# Patient Record
Sex: Female | Born: 1975 | Race: White | Hispanic: No | Marital: Single | Smoking: Never smoker
Health system: Southern US, Community
[De-identification: ages and names within clinical notes are randomized; demographics above are authoritative.]

## PROBLEM LIST (undated history)

## (undated) HISTORY — PX: OTHER SURGICAL HISTORY: SHX169

---

## 1998-03-07 ENCOUNTER — Other Ambulatory Visit: Admission: RE | Admit: 1998-03-07 | Discharge: 1998-03-07 | Payer: Self-pay | Admitting: Family Medicine

## 1998-03-08 ENCOUNTER — Other Ambulatory Visit: Admission: RE | Admit: 1998-03-08 | Discharge: 1998-03-08 | Payer: Self-pay | Admitting: Family Medicine

## 2009-09-20 ENCOUNTER — Ambulatory Visit (HOSPITAL_COMMUNITY): Admission: RE | Admit: 2009-09-20 | Discharge: 2009-09-20 | Payer: Self-pay | Admitting: Obstetrics and Gynecology

## 2009-10-17 ENCOUNTER — Ambulatory Visit (HOSPITAL_COMMUNITY): Admission: RE | Admit: 2009-10-17 | Discharge: 2009-10-17 | Payer: Self-pay | Admitting: Obstetrics and Gynecology

## 2009-12-31 ENCOUNTER — Encounter (INDEPENDENT_AMBULATORY_CARE_PROVIDER_SITE_OTHER): Payer: Self-pay | Admitting: Obstetrics and Gynecology

## 2009-12-31 ENCOUNTER — Inpatient Hospital Stay (HOSPITAL_COMMUNITY)
Admission: AD | Admit: 2009-12-31 | Discharge: 2010-01-02 | Payer: Self-pay | Source: Home / Self Care | Admitting: Obstetrics and Gynecology

## 2010-01-31 ENCOUNTER — Ambulatory Visit: Admission: RE | Admit: 2010-01-31 | Discharge: 2010-01-31 | Payer: Self-pay | Admitting: Obstetrics and Gynecology

## 2010-10-27 LAB — CBC
Hemoglobin: 10.6 g/dL — ABNORMAL LOW (ref 12.0–15.0)
Hemoglobin: 11.8 g/dL — ABNORMAL LOW (ref 12.0–15.0)
MCHC: 34.9 g/dL (ref 30.0–36.0)
MCV: 92.1 fL (ref 78.0–100.0)
Platelets: 185 10*3/uL (ref 150–400)
RBC: 3.29 MIL/uL — ABNORMAL LOW (ref 3.87–5.11)
RDW: 14 % (ref 11.5–15.5)
WBC: 9.9 10*3/uL (ref 4.0–10.5)

## 2010-12-30 ENCOUNTER — Telehealth: Payer: Self-pay | Admitting: Cardiology

## 2010-12-30 ENCOUNTER — Other Ambulatory Visit: Payer: Self-pay | Admitting: Cardiology

## 2010-12-30 DIAGNOSIS — R002 Palpitations: Secondary | ICD-10-CM

## 2010-12-30 NOTE — Telephone Encounter (Signed)
Pt informed of scheduled 2D Echo June 1st at 2pm at [facility]. To arrive at 145.  Also scheduled for Holter monitor placement at 330pm. At Dr Ronnald Nian office.

## 2010-12-30 NOTE — Telephone Encounter (Signed)
Pt called in to office. She recalls last seeing Dr Deborah Chalk in 2006. She is having palpations again as she had back in 2006. She is inquiring if she needs to go back on Toprol XL 25 mg a day. She is currently breastfeeding and plans to stop in the next week. She spoke with Dr Deborah Chalk and they have decided that a holter monitor and a 2D-Echo would be appropriate since she has had 3 children since here last episode. She will be out of town for the weekend and is available next week Wed afternoon, Anytime Thurs or Friday and also available anytime the following week. The nurse A. Opal Sidles will schedule the testing and then return call to the pt.

## 2011-01-09 ENCOUNTER — Ambulatory Visit (HOSPITAL_COMMUNITY): Payer: BC Managed Care – PPO | Attending: Cardiology | Admitting: Radiology

## 2011-01-09 ENCOUNTER — Other Ambulatory Visit (INDEPENDENT_AMBULATORY_CARE_PROVIDER_SITE_OTHER): Payer: BC Managed Care – PPO | Admitting: *Deleted

## 2011-01-09 DIAGNOSIS — I059 Rheumatic mitral valve disease, unspecified: Secondary | ICD-10-CM | POA: Insufficient documentation

## 2011-01-09 DIAGNOSIS — R002 Palpitations: Secondary | ICD-10-CM

## 2011-01-09 DIAGNOSIS — I079 Rheumatic tricuspid valve disease, unspecified: Secondary | ICD-10-CM | POA: Insufficient documentation

## 2011-01-09 DIAGNOSIS — I379 Nonrheumatic pulmonary valve disorder, unspecified: Secondary | ICD-10-CM | POA: Insufficient documentation

## 2011-01-14 ENCOUNTER — Telehealth: Payer: Self-pay | Admitting: Cardiology

## 2011-01-14 MED ORDER — METOPROLOL TARTRATE 25 MG PO TABS: ORAL_TABLET | ORAL | Status: DC

## 2011-01-14 NOTE — Telephone Encounter (Signed)
Pt notified of holter monitor results.  Pt instructed NOT to take Metoprolol as long as pt is breastfeeding.  Pt requested RN call in a prescription for Metoprolol for when pt stops breast feeding and to take PRN for palpitations.  RN will e-prescribe Metoprolol.

## 2011-01-14 NOTE — Telephone Encounter (Signed)
Called in wondering if Dr. Deborah Chalk wanted her to start back on the generic toprolol or if she should contiue to go on without it. Or if she does need to take it, if she could wait until she stops breast feeding before she resumes it. Please call back.

## 2011-01-14 NOTE — Progress Notes (Signed)
Pt called with normal results.

## 2011-06-02 ENCOUNTER — Other Ambulatory Visit: Payer: Self-pay | Admitting: Obstetrics and Gynecology

## 2011-06-02 DIAGNOSIS — R928 Other abnormal and inconclusive findings on diagnostic imaging of breast: Secondary | ICD-10-CM

## 2011-06-09 ENCOUNTER — Ambulatory Visit
Admission: RE | Admit: 2011-06-09 | Discharge: 2011-06-09 | Disposition: A | Discharge status: Home / Self Care | Payer: BC Managed Care – PPO | Source: Ambulatory Visit | Attending: Obstetrics and Gynecology | Admitting: Obstetrics and Gynecology

## 2011-06-09 DIAGNOSIS — R928 Other abnormal and inconclusive findings on diagnostic imaging of breast: Secondary | ICD-10-CM

## 2011-06-11 ENCOUNTER — Other Ambulatory Visit: Payer: BC Managed Care – PPO

## 2012-04-19 ENCOUNTER — Encounter: Payer: Self-pay | Admitting: Cardiology

## 2013-04-19 ENCOUNTER — Ambulatory Visit (INDEPENDENT_AMBULATORY_CARE_PROVIDER_SITE_OTHER): Payer: BC Managed Care – HMO | Admitting: Sports Medicine

## 2013-04-19 ENCOUNTER — Encounter: Payer: Self-pay | Admitting: Sports Medicine

## 2013-04-19 VITALS — Ht 66.5 in | Wt 135.0 lb

## 2013-04-19 DIAGNOSIS — R269 Unspecified abnormalities of gait and mobility: Secondary | ICD-10-CM

## 2013-04-19 DIAGNOSIS — M25562 Pain in left knee: Secondary | ICD-10-CM

## 2013-04-19 DIAGNOSIS — M25569 Pain in unspecified knee: Secondary | ICD-10-CM

## 2013-04-19 NOTE — Patient Instructions (Addendum)
No squats or deep knee presses  Compression stocking while running and 30 min - 1 hr after  No need for stocking while biking. Avoid strenuous biking up hills  No running for 1 wk, gradually increase thereafter  Return in 4 wks.

## 2013-04-19 NOTE — Assessment & Plan Note (Signed)
Compression sleeve for activity Icing after activity  Modification of exercise program to take out any activity with knee bend greater than 45  Recheck in one month

## 2013-04-19 NOTE — Progress Notes (Signed)
  Subjective:    Patient ID: Angela Foster, female    DOB: Apr 03, 1976, 37 y.o.   MRN: [id number]  HPI 37 y/o female with a hx of asthma here for concerns of left knee pain X 2 months duration. She is currently training for a triathlon is runs on average of 14 miles. She reports a hx of previous knee pain 5 yrs ago and was evaluated by ortho and had MRI which were negative and told to strengthen her quad muscles. Since then she has been doing a lot of biking, squats with weights and quad strengthening exercises. In regards to her left knee pain, it has been present for the past 2 months. No known injury or trauma. She has pain usually a few min into her run and goes away with the run and then sometimes lingers on after her run. She describes the above her knee cap and sometimes below her knee cap. She has pain on impact with running and going downstairs. Denies any swelling, locking or clicking, fever or chills.   Her weight training includes deep squats and deep lunges  History of swimmer's knee particularly with the breaststroke on the left  PMH: Asthma Meds:Qvar, Claritin, Vit D, Zoloft Social Hx: Denies any use of tobacco. She is a stay at home mom.  Fam Hx : HTN Allergies :NKDA  Review of Systems Negative except for noted in HPI.    Objective:   Physical Exam Athletic-appearing female in no acute distress  She has pes planus b/l She has a short 1st metatarsal b/l  Running gait shows that she pronates significantly on the right and mildly on the left. This causes some mild turnout of the right foot.  Left knee Clicking on Mcmurray's but no pain Normal flexion and extension Good quadriceps strength Patellar compression test reveals crepitation on the left but not on the right  Some pain on Thessaly test No point tenderness  Mild warmth on the left and some fullness in the suprapatellar pouch Normal Lachman's test    u/s left knee  She has some effusion below her quad  tendon in the suprapatellar pouch on the left. This extends about 5 cm up into the thigh  No effusion on the right knee She has no obvious meniscal tear She does have some spurring on the medial side    Assessment & Plan:  Left knee pain - may be secondary to patellofemoral syndrome probably related to dynamic muscle imbalance and abnormal tracking. She does have some effusion on u/s in the suprapatellar pouch. She has been given a compression sleeve along with sports insoles. She will avoid running for 1 wk and then gradually start running. She will continue biking with compression sleeve and ice afterwards. Avoid deep squat exercises. F/U in 4 wks and consider rechecking with u/s to see resolution of effusion.

## 2013-04-19 NOTE — Assessment & Plan Note (Signed)
Sports insoles were added with some arch support. She felt good with the sports inserts. The pronation on the left is corrected on the right is only about 75% corrected.  We will try this for one month and then add more arch support if needed

## 2013-05-25 ENCOUNTER — Encounter: Payer: Self-pay | Admitting: Sports Medicine

## 2013-05-25 ENCOUNTER — Ambulatory Visit (INDEPENDENT_AMBULATORY_CARE_PROVIDER_SITE_OTHER): Payer: BC Managed Care – PPO | Admitting: Sports Medicine

## 2013-05-25 VITALS — BP 119/71 | HR 57 | Ht 66.75 in | Wt 135.0 lb

## 2013-05-25 DIAGNOSIS — M25562 Pain in left knee: Secondary | ICD-10-CM

## 2013-05-25 DIAGNOSIS — M25569 Pain in unspecified knee: Secondary | ICD-10-CM

## 2013-05-25 NOTE — Assessment & Plan Note (Signed)
She continues to have some swelling and a mild degree of symptoms. She is however able to continue running without significant worsening of her symptoms. She'll complete her triathlon and then take an extended break from running. She will continue using the body helix compression sleeve. 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

## 2013-05-25 NOTE — Progress Notes (Signed)
Patient ID: Angela Foster, female   DOB: [birth year], 37 y.o.   MRN: [id number] 37 year old female presents for followup of left anterior knee pain. Currently training for a triathlon in 2 weeks. Diagnosed with patellofemoral chondromalacia and anterior knee pain one month ago. She's been wearing body helix compression sleeve, icing after work outs and took approximately one week completely off from running after her last visit. She continues to have some swelling in anterior knee discomfort when she does run in [place] thereafter. No pain with cycling unless riding hard uphills. No pain with swimming.  Continues to have pain on stairs most notably going downstairs.  Review of systems as per history of present illness otherwise negative.  Examination: BP 119/71  Pulse 57  Ht 5' 6.75" (1.695 m)  Wt 135 lb (61.236 kg)  BMI 21.31 kg/m2 Well-developed well-nourished 37 year old female awake alert oriented no acute distress  Left Knee: Inspection with no erythema, no significant effusion noted. Palpation with no warmth, joint line tenderness, patellar tenderness, or condyle tenderness. ROM full in flexion and extension and lower leg rotation. Ligaments with solid consistent endpoints including ACL, PCL, LCL, MCL. Negative Mcmurray's Mildly painful patellar compression. Patellar glide with significant crepitus. Patellar and quadriceps tendons unremarkable. Hamstring and quadriceps strength is normal.   Hip abduction strength is 5/5.  Musculoskeletal ultrasound of the left knee shows suprapatellar effusion, evidence of patellofemoral chondromalacia is noted under the inferior medial and inferior lateral aspect of the patella.

## 2014-03-13 ENCOUNTER — Ambulatory Visit (INDEPENDENT_AMBULATORY_CARE_PROVIDER_SITE_OTHER): Payer: BC Managed Care – PPO | Admitting: Internal Medicine

## 2014-03-13 VITALS — BP 110/68 | HR 66 | Temp 97.4°F | Resp 18

## 2014-03-13 DIAGNOSIS — S81002A Unspecified open wound, left knee, initial encounter: Secondary | ICD-10-CM

## 2014-03-13 DIAGNOSIS — S51809A Unspecified open wound of unspecified forearm, initial encounter: Secondary | ICD-10-CM

## 2014-03-13 DIAGNOSIS — S81009A Unspecified open wound, unspecified knee, initial encounter: Secondary | ICD-10-CM

## 2014-03-13 DIAGNOSIS — T07XXXA Unspecified multiple injuries, initial encounter: Secondary | ICD-10-CM

## 2014-03-13 DIAGNOSIS — S91009A Unspecified open wound, unspecified ankle, initial encounter: Secondary | ICD-10-CM

## 2014-03-13 DIAGNOSIS — S51802A Unspecified open wound of left forearm, initial encounter: Secondary | ICD-10-CM

## 2014-03-13 DIAGNOSIS — S51001A Unspecified open wound of right elbow, initial encounter: Secondary | ICD-10-CM

## 2014-03-13 DIAGNOSIS — S51009A Unspecified open wound of unspecified elbow, initial encounter: Secondary | ICD-10-CM

## 2014-03-13 DIAGNOSIS — S81809A Unspecified open wound, unspecified lower leg, initial encounter: Secondary | ICD-10-CM

## 2014-03-13 MED ORDER — MUPIROCIN 2 % EX OINT: 1.0000 "application " | TOPICAL_OINTMENT | Freq: Three times a day (TID) | CUTANEOUS | Status: DC

## 2014-03-13 MED ORDER — DOXYCYCLINE HYCLATE 100 MG PO TABS: 100.0000 mg | ORAL_TABLET | Freq: Two times a day (BID) | ORAL | Status: DC

## 2014-03-13 NOTE — Patient Instructions (Addendum)
WOUND CARE Please return in 7-10 days to have your stitches/staples removed or sooner if you have concerns. Marland Kitchen Keep area clean and dry for 24 hours. Do not remove bandage, if applied. . After 24 hours, remove bandage and wash wound gently with mild soap and warm water. Reapply a new bandage after cleaning wound, if directed. . Continue daily cleansing with soap and water until stitches/staples are removed. . Do not apply any ointments or creams to the wound while stitches/staples are in place, as this may cause delayed healing. . Notify the office if you experience any of the following signs of infection: Swelling, redness, pus drainage, streaking, fever >101.0 F . Notify the office if you experience excessive bleeding that does not stop after 15-20 minutes of constant, firm pressure.           Wound Debridement Wound debridement is a procedure used to remove dead tissue and contaminated substances from a wound. A wound must be clean to heal. It also must get a good supply of blood. Anything that is stopping this must be taken out of the wound. This could be dead tissue, scar tissue, fluid buildup, or debris from outside of the body. Wounds that are not cleaned by debridement heal slowly or not at all. They can become infected. Any infection in the tissue can also spread to nearby areas or to other parts of the body through the blood. Wound debridement can be done through a surgical procedure or various other methods. Debridement is sometimes done to get a sample of tissue from the wound. The tissue can be checked under a microscope or sent to a lab for testing.  LET YOUR CAREGIVER KNOW ABOUT:   Any allergies you have.  All medicines you are taking, including vitamins, steroids, herbs, eyedrops, and over-the-counter medicines and creams.   Previous problems you or members of your family have had with the use of anesthetics.   Any blood disorders you have had.   Previous  surgeries you have had.   Other health problems you have.  RISKS AND COMPLICATIONS  Generally, wound debridement is a safe procedure. However, as with any medical procedure, complications can occur. Possible complications include:  Bleeding that does not stop.   Infection.   Damage to nerves, blood vessels, or healthy tissue inside the wound.   Pain.   Lack of healing. BEFORE THE PROCEDURE   The caregiver will check the wound for signs of healing or infection. A measurement of the wound will be taken, including how deep it is. A metal tool (probe) may be used. Blood tests may be done to check for infection.   If you will be given medicine to make you sleep through the procedure (general anesthetic), do not eat or drink anything for at least 6 hours before the procedure. Ask your caregiver if it is okay to have a sip of water with any needed medicine.   Make plans to have someone drive you home after the procedure. Also, make sure someone can stay with you for a few days.  PROCEDURE  The following methods may be used alone or in combination. Surgical debridement:  Small monitors may be placed on your body. They are used to check your heart, blood pressure, and oxygen level.   You may be given medicine through an intravenous (IV) access tube in your hand or arm.   You might be given medicine to help you relax (sedative).   You may be given medicine to  numb the area around the wound (local anesthetic). If the wound is deep or wide, you may be given general anesthetic to make you sleep through the procedure.   Once you are asleep or the wound area is numb, the wound may be washed with a sterile saltwater solution.   Scissors, surgical knives (scalpels), and surgical tweezers (forceps) will be used to remove dead or dying tissue. Any other material that should not be in the wound will also be taken out.   After the tissue and other material have been removed from the  wound, the wound will be washed again.   A bandage (dressing) may be placed over the wound.  Mechanical debridement: Mechanical debridement may involve various techniques:  A dressing may be used to pull off dead tissue. A moist dressing is placed over the wound. It is left in place until it is dry. When the dressing is lifted off, this lifts away the dead tissue.   Whirlpool baths may be used to flush the wound with forceful streams of hot water.   The wound may be flushed with sterile solution.  Surgical instruments that use water under high pressure may be used to clean the wound. Chemical debridement:  A chemical medicine is put on the wound. The aim is to dissolve dead or dying tissue. Ointments may also be used. Autolytic debridement:  A special dressing is used to trap moisture inside the wound. The goal is for the wound to heal naturally under the dressing. Healing takes longer with this treatment. AFTER THE PROCEDURE   If a local anesthetic is used, you will be allowed to go home as soon as you are ready. If a general anesthetic is used, you will be taken to a recovery area until you are stable. Your blood pressure and pulse will be checked often. You may continue to get fluids through the IV tube for a while. Once you are stable, you may be able to go home, or you may need to stay in the hospital overnight.Your caregiver will decide when you can go home.   You may feel some pain. You will likely be given medicine for pain.  Before you go home, make sure you know how to care for the wound. This includes knowing when the dressing should be changed and how to change it.   Set up a follow-up appointment before leaving. Document Released: 10/21/2009 Document Revised: 07/13/2012 Document Reviewed: 04/13/2012 [facility] Patient Information 2015 [place], [place]. This information is not intended to replace advice given to you by your health care provider. Make sure you discuss  any questions you have with your health care provider.

## 2014-03-13 NOTE — Progress Notes (Signed)
   Angela Foster is a 38 y.o. female who presents for evaluation of a laceration to the lower leg, left size measuring 2 cm in length x 2 areas.  Verbal consent obtained explaining risks and benefits of procedure, pt agreeable to procedure.  The wound area was irrigated with sterile saline, irrigated with sterile water and draped in a sterile fashion. The wound area was anesthetized with Lidocaine 1% without epinephrine without added sodium bicarbonate. The wound was explored with the following results Foreign bodies found and removed, No tendon laceration seen. The wound was repaired with 3-0 Nylon; 7 sutures were used. The wound was dressed and antibiotic ointment was applied.  F/U in 7-10 days for suture removal.  Topical Bactroban Rx and wound care discussedalong with Doxycycline if needed.  Tamela Oddi Willie Loy, DO of [facility] 03/13/2014, 10:12 AM

## 2014-03-13 NOTE — Progress Notes (Signed)
   Subjective:    Patient ID: Angela Foster, female    DOB: [birth year], 38 y.o.   MRN: [id number]  HPI 38 year old female pt here for laceration on left knee. Pt tripped and fell while running at [facility] this morning. Pt also has a wound on right elbow and left forearm. Pt's last tetanus was December of 2008.   All bones and joints have full function, she ran home after injury.She has no health issues.  Review of Systems     Objective:   Physical Exam  Vitals reviewed. Constitutional: She is oriented to person, place, and time. She appears well-developed and well-nourished. She appears distressed.  HENT:  Head: Normocephalic.  Eyes: EOM are normal. Pupils are equal, round, and reactive to light.  Neck: Normal range of motion. Neck supple.  Pulmonary/Chest: Effort normal.  Abdominal: There is no tenderness.  Musculoskeletal: She exhibits edema and tenderness.       Left knee: She exhibits swelling, ecchymosis, laceration and erythema. She exhibits normal range of motion, no effusion, no deformity, normal alignment, no LCL laxity and normal patellar mobility. Tenderness found. No medial joint line, no lateral joint line, no MCL, no LCL and no patellar tendon tenderness noted.       Legs: Deep road rash with need for sutures and debridement  Neurological: She is alert and oriented to person, place, and time. No cranial nerve deficit or sensory deficit. She exhibits normal muscle tone. Coordination and gait normal.  Skin: Abrasion, ecchymosis and laceration noted. There is erythema.     Psychiatric: She has a normal mood and affect. Her behavior is normal. Thought content normal.   Wound debridement/cleaning/sutures       Assessment & Plan:  Wounds left knee and both arms Wound care

## 2014-03-13 NOTE — Progress Notes (Signed)
   Subjective:    Patient ID: Angela Foster, female    DOB: January 15, 1976, 38 y.o.   MRN: [id number]  HPI    Review of Systems     Objective:   Physical Exam        Assessment & Plan:

## 2014-09-26 ENCOUNTER — Encounter: Payer: Self-pay | Admitting: Sports Medicine

## 2014-09-26 ENCOUNTER — Ambulatory Visit (INDEPENDENT_AMBULATORY_CARE_PROVIDER_SITE_OTHER): Payer: BLUE CROSS/BLUE SHIELD | Admitting: Sports Medicine

## 2014-09-26 VITALS — BP 135/82 | HR 65 | Ht 66.0 in | Wt 137.0 lb

## 2014-09-26 DIAGNOSIS — M25562 Pain in left knee: Secondary | ICD-10-CM

## 2014-09-26 NOTE — Patient Instructions (Signed)
You have a small amount of extra fluid on your right knee. Your knee pain is caused by extra wear under your knee cap because of how it is tracking. -A few things to recover faster: -Avoid deep lunges (beyond 45 degrees) or squats -Use a compression sleeve, ice, and elevate -We modified the insoles with scaphoid padding and first ray posting  A few exercises to key in to correct muscle imbalance: 1. Decline (on your tip-toes), squats to just 45 degrees with a 10lb weight in each hand. 10-15 x 3 sets 3 days a week 2. Laying on your side, raise leg up and then bring back down. 10-15 x 3 sets. Repeat each side.0 3. Lateral shuffle: 30 yard lateral shuffle back and forth 5 times.  You can modify these with bands or gradually increase weights.  Plan follow-up in 2-3 months if needed. Call sooner if needed.

## 2014-09-26 NOTE — Progress Notes (Signed)
   Subjective:    Patient ID: Angela Foster, female    DOB: [birth year], 39 y.o.   MRN: [id number]  HPI Mrs. Angela Foster is a 39 year old female who presents with left knee pain. She was last seen a year and a half ago for a  similar problem. Her symptoms have been worsening for 1-2 months, without any known acute injury. Location of pain is primarily the superior lateral anterior knee. She notes associated crepitus with bending her knee, but denies any locking. She has been swimming, biking, and running for exercise. She is running only about 5 miles per week. She denies any swelling. She wears tennis shoes with green sport insoles.  Past medical history, social history, medications, and allergies were reviewed and are up to date in the chart.  Review of Systems 7 point review of systems was performed and was otherwise negative unless noted in the history of present illness.     Objective:   Physical Exam BP 135/82 mmHg  Pulse 65  Ht 5' 6"  (1.676 m)  Wt 137 lb (62.143 kg)  BMI 22.12 kg/m2 GEN: The patient is well-developed well-nourished female and in no acute distress.  She is awake alert and oriented x3. SKIN: warm and well-perfused, no rash  EXTR: No lower extremity edema or calf tenderness Neuro: Strength 5/5 globally. Sensation intact throughout. No focal deficits. Vasc: +2 bilateral distal pulses. No edema.  MSK: Examination of the left knee reveals trace effusion. No medial or lateral joint line tenderness. Negative patellar grind test. No valgus or varus instability. The patellar tendon is palpably intact. Standing structural exam reveals dropping of the medial longitudinal arches with turning in of the bilateral knees and valgus alignment. Her running form is quite good. Testing of her hip abductors reveals slight weakness on the left. No leg length discrepancy.  Limited musculoskeletal ultrasound: Long and short axis views were obtained of the left knee which reveals a mild to  moderate-sized effusion in the left knee compared to the right. The medial and lateral menisci appear normal. The vastus lateralis appears intact as well as at its insertion on the superior lateral patella. Patellar tendon appears normal.     Assessment & Plan:  Plan Please see problem based assessment and plan in the problem list.

## 2014-09-26 NOTE — Assessment & Plan Note (Addendum)
Mild to moderate left knee effusion. Most likely due to patellofemoral syndrome. -Home exercise program for hip abductor strengthening. Avoid deep lunges or squats. -Added bilateral scaphoid pads and first ray posts, which corrected her gait abnormality. -Compression sleeve to be used when active, rest, ice, elevation -We would like her to follow-up in one month, at which point we would consider crafting of custom orthotics if she is satisfied with these modifications.

## 2014-10-31 ENCOUNTER — Encounter: Payer: BLUE CROSS/BLUE SHIELD | Admitting: Sports Medicine

## 2014-12-12 ENCOUNTER — Encounter: Payer: BLUE CROSS/BLUE SHIELD | Admitting: Sports Medicine

## 2015-07-02 ENCOUNTER — Ambulatory Visit (INDEPENDENT_AMBULATORY_CARE_PROVIDER_SITE_OTHER): Payer: BLUE CROSS/BLUE SHIELD | Admitting: Sports Medicine

## 2015-07-02 ENCOUNTER — Encounter: Payer: Self-pay | Admitting: Sports Medicine

## 2015-07-02 VITALS — BP 115/88 | Ht 67.0 in | Wt 137.0 lb

## 2015-07-02 DIAGNOSIS — M25562 Pain in left knee: Secondary | ICD-10-CM

## 2015-07-02 DIAGNOSIS — R269 Unspecified abnormalities of gait and mobility: Secondary | ICD-10-CM | POA: Diagnosis not present

## 2015-07-02 NOTE — Progress Notes (Signed)
Patient ID: Angela Foster, female   DOB: [birth year], 39 y.o.   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 This led to an abnormal gait with significant pronation  She states that she is about 80% better since using sports insoles with medial arch support Hip exercises have helped the knee pain as well  Past medical history is not remarkable  Review of systems No swelling of the MTP joints No plantar fascial pain No giving way or locking of the knees No swelling of the knees She has been able to resume some running since we put her in insoles  Physical examination No acute distress BP 115/88 mmHg  Ht 5' 7"  (1.702 m)  Wt 137 lb (62.143 kg)  BMI 21.45 kg/m2  Feet bilaterally show loss of longitudinal arch Short first metatarsals Right great toe shows hallux deviation MTP joint still shows good flexion and extension No tenderness to palpation of the plantar fashion No swelling, metatarsal phalangeal joints  Gait shows walking pronation Foot strike pattern is along the medial foot This is documented by the wear pattern on her insoles

## 2015-07-02 NOTE — Assessment & Plan Note (Signed)
Her hip abduction strength is now excellent  Her quadriceps strength is now excellent  She now has minimal knee pain and I think this approach is working along with her orthotic use

## 2015-07-02 NOTE — Assessment & Plan Note (Signed)
Patient was fitted for a : standard, cushioned, semi-rigid orthotic. The orthotic was heated and afterward the patient stood on the orthotic blank positioned on the orthotic stand. The patient was positioned in subtalar neutral position and 10 degrees of ankle dorsiflexion in a weight bearing stance. After completion of molding, a stable base was applied to the orthotic blank. The blank was ground to a stable position for weight bearing. Size: 8 red EVA Base: Blue EVA Posting: first ray on RT Additional orthotic padding: none  At completion of the orthotic she had significant improvement in her post orthotic running gait Almost all of her pronation was corrected She no longer has foot turnout on the right She felt very comfortable with good pain relief  We spent 45 minutes of face-to-face evaluation with over 50% spent on counseling regarding using orthotic and arch support to protect gait and working on strength to resolve the knee pain.

## 2017-08-25 DIAGNOSIS — R5383 Other fatigue: Secondary | ICD-10-CM | POA: Diagnosis not present

## 2017-08-25 DIAGNOSIS — E039 Hypothyroidism, unspecified: Secondary | ICD-10-CM | POA: Diagnosis not present

## 2017-09-24 DIAGNOSIS — M5408 Panniculitis affecting regions of neck and back, sacral and sacrococcygeal region: Secondary | ICD-10-CM | POA: Diagnosis not present

## 2017-09-24 DIAGNOSIS — M545 Low back pain: Secondary | ICD-10-CM | POA: Diagnosis not present

## 2017-09-24 DIAGNOSIS — M9903 Segmental and somatic dysfunction of lumbar region: Secondary | ICD-10-CM | POA: Diagnosis not present

## 2017-09-24 DIAGNOSIS — M7918 Myalgia, other site: Secondary | ICD-10-CM | POA: Diagnosis not present

## 2017-10-06 DIAGNOSIS — G43909 Migraine, unspecified, not intractable, without status migrainosus: Secondary | ICD-10-CM | POA: Diagnosis not present

## 2017-10-06 DIAGNOSIS — H35411 Lattice degeneration of retina, right eye: Secondary | ICD-10-CM | POA: Diagnosis not present

## 2017-10-06 DIAGNOSIS — H539 Unspecified visual disturbance: Secondary | ICD-10-CM | POA: Diagnosis not present

## 2017-11-15 DIAGNOSIS — G44209 Tension-type headache, unspecified, not intractable: Secondary | ICD-10-CM | POA: Diagnosis not present

## 2017-11-15 DIAGNOSIS — G43909 Migraine, unspecified, not intractable, without status migrainosus: Secondary | ICD-10-CM | POA: Diagnosis not present

## 2017-11-29 DIAGNOSIS — M7918 Myalgia, other site: Secondary | ICD-10-CM | POA: Diagnosis not present

## 2017-11-29 DIAGNOSIS — M9901 Segmental and somatic dysfunction of cervical region: Secondary | ICD-10-CM | POA: Diagnosis not present

## 2017-11-29 DIAGNOSIS — M5402 Panniculitis affecting regions of neck and back, cervical region: Secondary | ICD-10-CM | POA: Diagnosis not present

## 2018-02-02 DIAGNOSIS — M25559 Pain in unspecified hip: Secondary | ICD-10-CM | POA: Diagnosis not present

## 2018-02-02 DIAGNOSIS — M542 Cervicalgia: Secondary | ICD-10-CM | POA: Diagnosis not present

## 2018-02-02 DIAGNOSIS — G4700 Insomnia, unspecified: Secondary | ICD-10-CM | POA: Diagnosis not present

## 2018-02-18 DIAGNOSIS — A630 Anogenital (venereal) warts: Secondary | ICD-10-CM | POA: Diagnosis not present

## 2018-02-18 DIAGNOSIS — Z630 Problems in relationship with spouse or partner: Secondary | ICD-10-CM | POA: Diagnosis not present

## 2018-02-18 DIAGNOSIS — Z01419 Encounter for gynecological examination (general) (routine) without abnormal findings: Secondary | ICD-10-CM | POA: Diagnosis not present

## 2018-02-18 DIAGNOSIS — Z01411 Encounter for gynecological examination (general) (routine) with abnormal findings: Secondary | ICD-10-CM | POA: Diagnosis not present

## 2018-02-21 DIAGNOSIS — M5412 Radiculopathy, cervical region: Secondary | ICD-10-CM | POA: Diagnosis not present

## 2018-02-21 DIAGNOSIS — G43909 Migraine, unspecified, not intractable, without status migrainosus: Secondary | ICD-10-CM | POA: Diagnosis not present

## 2018-02-21 DIAGNOSIS — G44209 Tension-type headache, unspecified, not intractable: Secondary | ICD-10-CM | POA: Diagnosis not present

## 2018-02-21 DIAGNOSIS — M542 Cervicalgia: Secondary | ICD-10-CM | POA: Diagnosis not present

## 2018-02-23 DIAGNOSIS — G43009 Migraine without aura, not intractable, without status migrainosus: Secondary | ICD-10-CM | POA: Diagnosis not present

## 2018-02-23 DIAGNOSIS — M791 Myalgia, unspecified site: Secondary | ICD-10-CM | POA: Diagnosis not present

## 2018-02-23 DIAGNOSIS — M9901 Segmental and somatic dysfunction of cervical region: Secondary | ICD-10-CM | POA: Diagnosis not present

## 2018-02-23 DIAGNOSIS — M542 Cervicalgia: Secondary | ICD-10-CM | POA: Diagnosis not present

## 2018-02-25 DIAGNOSIS — M542 Cervicalgia: Secondary | ICD-10-CM | POA: Diagnosis not present

## 2018-02-25 DIAGNOSIS — M791 Myalgia, unspecified site: Secondary | ICD-10-CM | POA: Diagnosis not present

## 2018-02-25 DIAGNOSIS — M9901 Segmental and somatic dysfunction of cervical region: Secondary | ICD-10-CM | POA: Diagnosis not present

## 2018-02-25 DIAGNOSIS — G43009 Migraine without aura, not intractable, without status migrainosus: Secondary | ICD-10-CM | POA: Diagnosis not present

## 2018-03-02 DIAGNOSIS — M791 Myalgia, unspecified site: Secondary | ICD-10-CM | POA: Diagnosis not present

## 2018-03-02 DIAGNOSIS — M9901 Segmental and somatic dysfunction of cervical region: Secondary | ICD-10-CM | POA: Diagnosis not present

## 2018-03-02 DIAGNOSIS — G43009 Migraine without aura, not intractable, without status migrainosus: Secondary | ICD-10-CM | POA: Diagnosis not present

## 2018-03-02 DIAGNOSIS — M542 Cervicalgia: Secondary | ICD-10-CM | POA: Diagnosis not present

## 2018-03-04 DIAGNOSIS — M791 Myalgia, unspecified site: Secondary | ICD-10-CM | POA: Diagnosis not present

## 2018-03-04 DIAGNOSIS — M9901 Segmental and somatic dysfunction of cervical region: Secondary | ICD-10-CM | POA: Diagnosis not present

## 2018-03-04 DIAGNOSIS — G43009 Migraine without aura, not intractable, without status migrainosus: Secondary | ICD-10-CM | POA: Diagnosis not present

## 2018-03-04 DIAGNOSIS — M542 Cervicalgia: Secondary | ICD-10-CM | POA: Diagnosis not present

## 2018-03-11 DIAGNOSIS — M542 Cervicalgia: Secondary | ICD-10-CM | POA: Diagnosis not present

## 2018-03-11 DIAGNOSIS — M9901 Segmental and somatic dysfunction of cervical region: Secondary | ICD-10-CM | POA: Diagnosis not present

## 2018-03-11 DIAGNOSIS — M791 Myalgia, unspecified site: Secondary | ICD-10-CM | POA: Diagnosis not present

## 2018-03-11 DIAGNOSIS — G43009 Migraine without aura, not intractable, without status migrainosus: Secondary | ICD-10-CM | POA: Diagnosis not present

## 2018-03-16 DIAGNOSIS — M542 Cervicalgia: Secondary | ICD-10-CM | POA: Diagnosis not present

## 2018-03-16 DIAGNOSIS — G43009 Migraine without aura, not intractable, without status migrainosus: Secondary | ICD-10-CM | POA: Diagnosis not present

## 2018-03-16 DIAGNOSIS — M791 Myalgia, unspecified site: Secondary | ICD-10-CM | POA: Diagnosis not present

## 2018-03-16 DIAGNOSIS — M9901 Segmental and somatic dysfunction of cervical region: Secondary | ICD-10-CM | POA: Diagnosis not present

## 2018-04-01 DIAGNOSIS — G43009 Migraine without aura, not intractable, without status migrainosus: Secondary | ICD-10-CM | POA: Diagnosis not present

## 2018-04-01 DIAGNOSIS — M9901 Segmental and somatic dysfunction of cervical region: Secondary | ICD-10-CM | POA: Diagnosis not present

## 2018-04-01 DIAGNOSIS — M542 Cervicalgia: Secondary | ICD-10-CM | POA: Diagnosis not present

## 2018-04-01 DIAGNOSIS — M791 Myalgia, unspecified site: Secondary | ICD-10-CM | POA: Diagnosis not present

## 2018-04-06 DIAGNOSIS — M9901 Segmental and somatic dysfunction of cervical region: Secondary | ICD-10-CM | POA: Diagnosis not present

## 2018-04-06 DIAGNOSIS — M542 Cervicalgia: Secondary | ICD-10-CM | POA: Diagnosis not present

## 2018-04-06 DIAGNOSIS — M791 Myalgia, unspecified site: Secondary | ICD-10-CM | POA: Diagnosis not present

## 2018-04-06 DIAGNOSIS — G43009 Migraine without aura, not intractable, without status migrainosus: Secondary | ICD-10-CM | POA: Diagnosis not present

## 2018-04-20 DIAGNOSIS — M5412 Radiculopathy, cervical region: Secondary | ICD-10-CM | POA: Diagnosis not present

## 2018-04-20 DIAGNOSIS — M542 Cervicalgia: Secondary | ICD-10-CM | POA: Diagnosis not present

## 2018-05-19 DIAGNOSIS — G43009 Migraine without aura, not intractable, without status migrainosus: Secondary | ICD-10-CM | POA: Diagnosis not present

## 2018-05-19 DIAGNOSIS — M791 Myalgia, unspecified site: Secondary | ICD-10-CM | POA: Diagnosis not present

## 2018-05-19 DIAGNOSIS — M542 Cervicalgia: Secondary | ICD-10-CM | POA: Diagnosis not present

## 2018-05-19 DIAGNOSIS — M9901 Segmental and somatic dysfunction of cervical region: Secondary | ICD-10-CM | POA: Diagnosis not present

## 2018-06-29 DIAGNOSIS — M5412 Radiculopathy, cervical region: Secondary | ICD-10-CM | POA: Diagnosis not present

## 2018-06-29 DIAGNOSIS — G44209 Tension-type headache, unspecified, not intractable: Secondary | ICD-10-CM | POA: Diagnosis not present

## 2018-06-29 DIAGNOSIS — M542 Cervicalgia: Secondary | ICD-10-CM | POA: Diagnosis not present

## 2018-06-29 DIAGNOSIS — G43909 Migraine, unspecified, not intractable, without status migrainosus: Secondary | ICD-10-CM | POA: Diagnosis not present

## 2018-07-14 DIAGNOSIS — R9082 White matter disease, unspecified: Secondary | ICD-10-CM | POA: Diagnosis not present

## 2018-07-14 DIAGNOSIS — M50222 Other cervical disc displacement at C5-C6 level: Secondary | ICD-10-CM | POA: Diagnosis not present

## 2018-07-14 DIAGNOSIS — M2578 Osteophyte, vertebrae: Secondary | ICD-10-CM | POA: Diagnosis not present

## 2018-07-28 DIAGNOSIS — M5402 Panniculitis affecting regions of neck and back, cervical region: Secondary | ICD-10-CM | POA: Diagnosis not present

## 2018-07-28 DIAGNOSIS — M9905 Segmental and somatic dysfunction of pelvic region: Secondary | ICD-10-CM | POA: Diagnosis not present

## 2018-07-28 DIAGNOSIS — M9903 Segmental and somatic dysfunction of lumbar region: Secondary | ICD-10-CM | POA: Diagnosis not present

## 2018-07-28 DIAGNOSIS — M9901 Segmental and somatic dysfunction of cervical region: Secondary | ICD-10-CM | POA: Diagnosis not present

## 2018-08-08 ENCOUNTER — Other Ambulatory Visit: Payer: Self-pay | Admitting: Obstetrics and Gynecology

## 2018-08-08 DIAGNOSIS — E221 Hyperprolactinemia: Secondary | ICD-10-CM

## 2018-08-14 ENCOUNTER — Ambulatory Visit
Admission: RE | Admit: 2018-08-14 | Discharge: 2018-08-14 | Disposition: A | Discharge status: Home / Self Care | Payer: BLUE CROSS/BLUE SHIELD | Source: Ambulatory Visit | Attending: Obstetrics and Gynecology | Admitting: Obstetrics and Gynecology

## 2018-08-14 DIAGNOSIS — E221 Hyperprolactinemia: Secondary | ICD-10-CM

## 2018-08-14 MED ORDER — GADOBENATE DIMEGLUMINE 529 MG/ML IV SOLN
7.0000 mL | Freq: Once | INTRAVENOUS | Status: AC | PRN
  Administered 2018-08-14: 7 mL via INTRAVENOUS

## 2018-08-31 DIAGNOSIS — G43909 Migraine, unspecified, not intractable, without status migrainosus: Secondary | ICD-10-CM | POA: Diagnosis not present

## 2018-08-31 DIAGNOSIS — M5412 Radiculopathy, cervical region: Secondary | ICD-10-CM | POA: Diagnosis not present

## 2018-08-31 DIAGNOSIS — G44209 Tension-type headache, unspecified, not intractable: Secondary | ICD-10-CM | POA: Diagnosis not present

## 2018-09-12 DIAGNOSIS — M545 Low back pain: Secondary | ICD-10-CM | POA: Diagnosis not present

## 2018-09-12 DIAGNOSIS — M5481 Occipital neuralgia: Secondary | ICD-10-CM | POA: Diagnosis not present

## 2018-09-12 DIAGNOSIS — M542 Cervicalgia: Secondary | ICD-10-CM | POA: Diagnosis not present

## 2018-09-28 DIAGNOSIS — R7989 Other specified abnormal findings of blood chemistry: Secondary | ICD-10-CM | POA: Diagnosis not present

## 2018-09-28 DIAGNOSIS — Z1329 Encounter for screening for other suspected endocrine disorder: Secondary | ICD-10-CM | POA: Diagnosis not present

## 2018-10-11 DIAGNOSIS — M9903 Segmental and somatic dysfunction of lumbar region: Secondary | ICD-10-CM | POA: Diagnosis not present

## 2018-10-11 DIAGNOSIS — M9901 Segmental and somatic dysfunction of cervical region: Secondary | ICD-10-CM | POA: Diagnosis not present

## 2018-10-11 DIAGNOSIS — M9902 Segmental and somatic dysfunction of thoracic region: Secondary | ICD-10-CM | POA: Diagnosis not present

## 2018-10-11 DIAGNOSIS — M62830 Muscle spasm of back: Secondary | ICD-10-CM | POA: Diagnosis not present

## 2018-10-19 DIAGNOSIS — M5481 Occipital neuralgia: Secondary | ICD-10-CM | POA: Diagnosis not present

## 2018-10-19 DIAGNOSIS — M542 Cervicalgia: Secondary | ICD-10-CM | POA: Diagnosis not present

## 2018-10-19 DIAGNOSIS — M50222 Other cervical disc displacement at C5-C6 level: Secondary | ICD-10-CM | POA: Diagnosis not present

## 2018-11-22 DIAGNOSIS — M542 Cervicalgia: Secondary | ICD-10-CM | POA: Diagnosis not present

## 2018-11-22 DIAGNOSIS — M5416 Radiculopathy, lumbar region: Secondary | ICD-10-CM | POA: Diagnosis not present

## 2018-12-20 DIAGNOSIS — M542 Cervicalgia: Secondary | ICD-10-CM | POA: Diagnosis not present

## 2018-12-20 DIAGNOSIS — M545 Low back pain: Secondary | ICD-10-CM | POA: Diagnosis not present

## 2018-12-20 DIAGNOSIS — M5481 Occipital neuralgia: Secondary | ICD-10-CM | POA: Diagnosis not present

## 2018-12-21 DIAGNOSIS — G243 Spasmodic torticollis: Secondary | ICD-10-CM | POA: Diagnosis not present

## 2018-12-21 DIAGNOSIS — M5412 Radiculopathy, cervical region: Secondary | ICD-10-CM | POA: Diagnosis not present

## 2018-12-21 DIAGNOSIS — M5481 Occipital neuralgia: Secondary | ICD-10-CM | POA: Diagnosis not present

## 2018-12-21 DIAGNOSIS — M542 Cervicalgia: Secondary | ICD-10-CM | POA: Diagnosis not present

## 2019-01-04 DIAGNOSIS — G243 Spasmodic torticollis: Secondary | ICD-10-CM | POA: Diagnosis not present

## 2019-01-04 DIAGNOSIS — M542 Cervicalgia: Secondary | ICD-10-CM | POA: Diagnosis not present

## 2019-01-04 DIAGNOSIS — M5412 Radiculopathy, cervical region: Secondary | ICD-10-CM | POA: Diagnosis not present

## 2019-01-06 DIAGNOSIS — Z0000 Encounter for general adult medical examination without abnormal findings: Secondary | ICD-10-CM | POA: Diagnosis not present

## 2019-01-06 DIAGNOSIS — E221 Hyperprolactinemia: Secondary | ICD-10-CM | POA: Diagnosis not present

## 2019-01-06 DIAGNOSIS — Z13220 Encounter for screening for lipoid disorders: Secondary | ICD-10-CM | POA: Diagnosis not present

## 2019-01-06 DIAGNOSIS — R7989 Other specified abnormal findings of blood chemistry: Secondary | ICD-10-CM | POA: Diagnosis not present

## 2019-01-06 DIAGNOSIS — E559 Vitamin D deficiency, unspecified: Secondary | ICD-10-CM | POA: Diagnosis not present

## 2019-01-24 DIAGNOSIS — F419 Anxiety disorder, unspecified: Secondary | ICD-10-CM | POA: Diagnosis not present

## 2019-01-24 DIAGNOSIS — J4520 Mild intermittent asthma, uncomplicated: Secondary | ICD-10-CM | POA: Diagnosis not present

## 2019-01-24 DIAGNOSIS — G4700 Insomnia, unspecified: Secondary | ICD-10-CM | POA: Diagnosis not present

## 2019-01-24 DIAGNOSIS — I493 Ventricular premature depolarization: Secondary | ICD-10-CM | POA: Diagnosis not present

## 2019-02-14 DIAGNOSIS — M9903 Segmental and somatic dysfunction of lumbar region: Secondary | ICD-10-CM | POA: Diagnosis not present

## 2019-02-14 DIAGNOSIS — M9901 Segmental and somatic dysfunction of cervical region: Secondary | ICD-10-CM | POA: Diagnosis not present

## 2019-02-14 DIAGNOSIS — M5402 Panniculitis affecting regions of neck and back, cervical region: Secondary | ICD-10-CM | POA: Diagnosis not present

## 2019-02-14 DIAGNOSIS — M9905 Segmental and somatic dysfunction of pelvic region: Secondary | ICD-10-CM | POA: Diagnosis not present

## 2019-02-16 DIAGNOSIS — M545 Low back pain: Secondary | ICD-10-CM | POA: Diagnosis not present

## 2019-02-28 DIAGNOSIS — M542 Cervicalgia: Secondary | ICD-10-CM | POA: Diagnosis not present

## 2019-02-28 DIAGNOSIS — M9903 Segmental and somatic dysfunction of lumbar region: Secondary | ICD-10-CM | POA: Diagnosis not present

## 2019-02-28 DIAGNOSIS — M9901 Segmental and somatic dysfunction of cervical region: Secondary | ICD-10-CM | POA: Diagnosis not present

## 2019-02-28 DIAGNOSIS — M9905 Segmental and somatic dysfunction of pelvic region: Secondary | ICD-10-CM | POA: Diagnosis not present

## 2019-02-28 DIAGNOSIS — M5402 Panniculitis affecting regions of neck and back, cervical region: Secondary | ICD-10-CM | POA: Diagnosis not present

## 2019-02-28 DIAGNOSIS — M545 Low back pain: Secondary | ICD-10-CM | POA: Diagnosis not present

## 2019-03-06 DIAGNOSIS — R3915 Urgency of urination: Secondary | ICD-10-CM | POA: Diagnosis not present

## 2019-03-06 DIAGNOSIS — Z01411 Encounter for gynecological examination (general) (routine) with abnormal findings: Secondary | ICD-10-CM | POA: Diagnosis not present

## 2019-03-06 DIAGNOSIS — Z8742 Personal history of other diseases of the female genital tract: Secondary | ICD-10-CM | POA: Diagnosis not present

## 2019-03-06 DIAGNOSIS — Z01419 Encounter for gynecological examination (general) (routine) without abnormal findings: Secondary | ICD-10-CM | POA: Diagnosis not present

## 2019-03-06 DIAGNOSIS — R102 Pelvic and perineal pain: Secondary | ICD-10-CM | POA: Diagnosis not present

## 2019-03-06 DIAGNOSIS — R350 Frequency of micturition: Secondary | ICD-10-CM | POA: Diagnosis not present

## 2019-03-06 DIAGNOSIS — Z3202 Encounter for pregnancy test, result negative: Secondary | ICD-10-CM | POA: Diagnosis not present

## 2019-03-06 DIAGNOSIS — R1032 Left lower quadrant pain: Secondary | ICD-10-CM | POA: Diagnosis not present

## 2019-03-07 DIAGNOSIS — M5412 Radiculopathy, cervical region: Secondary | ICD-10-CM | POA: Diagnosis not present

## 2019-03-07 DIAGNOSIS — G243 Spasmodic torticollis: Secondary | ICD-10-CM | POA: Diagnosis not present

## 2019-03-07 DIAGNOSIS — G43709 Chronic migraine without aura, not intractable, without status migrainosus: Secondary | ICD-10-CM | POA: Diagnosis not present

## 2019-03-07 DIAGNOSIS — M542 Cervicalgia: Secondary | ICD-10-CM | POA: Diagnosis not present

## 2019-03-23 DIAGNOSIS — M5412 Radiculopathy, cervical region: Secondary | ICD-10-CM | POA: Diagnosis not present

## 2019-03-23 DIAGNOSIS — M5416 Radiculopathy, lumbar region: Secondary | ICD-10-CM | POA: Diagnosis not present

## 2019-03-23 DIAGNOSIS — M545 Low back pain: Secondary | ICD-10-CM | POA: Diagnosis not present

## 2019-03-23 DIAGNOSIS — M542 Cervicalgia: Secondary | ICD-10-CM | POA: Diagnosis not present

## 2019-03-24 DIAGNOSIS — M5441 Lumbago with sciatica, right side: Secondary | ICD-10-CM | POA: Diagnosis not present

## 2019-03-30 DIAGNOSIS — M545 Low back pain: Secondary | ICD-10-CM | POA: Diagnosis not present

## 2019-03-31 DIAGNOSIS — R102 Pelvic and perineal pain: Secondary | ICD-10-CM | POA: Diagnosis not present

## 2019-04-03 DIAGNOSIS — N200 Calculus of kidney: Secondary | ICD-10-CM | POA: Diagnosis not present

## 2019-04-03 DIAGNOSIS — R1031 Right lower quadrant pain: Secondary | ICD-10-CM | POA: Diagnosis not present

## 2019-04-03 DIAGNOSIS — M79606 Pain in leg, unspecified: Secondary | ICD-10-CM | POA: Diagnosis not present

## 2019-04-04 DIAGNOSIS — R102 Pelvic and perineal pain: Secondary | ICD-10-CM | POA: Diagnosis not present

## 2019-04-04 DIAGNOSIS — M5126 Other intervertebral disc displacement, lumbar region: Secondary | ICD-10-CM | POA: Diagnosis not present

## 2019-04-04 DIAGNOSIS — M5127 Other intervertebral disc displacement, lumbosacral region: Secondary | ICD-10-CM | POA: Diagnosis not present

## 2019-04-04 DIAGNOSIS — Z87891 Personal history of nicotine dependence: Secondary | ICD-10-CM | POA: Diagnosis not present

## 2019-04-04 DIAGNOSIS — M5430 Sciatica, unspecified side: Secondary | ICD-10-CM | POA: Diagnosis not present

## 2019-04-04 DIAGNOSIS — M5432 Sciatica, left side: Secondary | ICD-10-CM | POA: Diagnosis not present

## 2019-04-07 ENCOUNTER — Other Ambulatory Visit: Payer: Self-pay

## 2019-04-07 DIAGNOSIS — M5116 Intervertebral disc disorders with radiculopathy, lumbar region: Secondary | ICD-10-CM | POA: Diagnosis not present

## 2019-04-07 DIAGNOSIS — M5106 Intervertebral disc disorders with myelopathy, lumbar region: Secondary | ICD-10-CM | POA: Diagnosis not present

## 2019-04-07 DIAGNOSIS — S338XXA Sprain of other parts of lumbar spine and pelvis, initial encounter: Secondary | ICD-10-CM | POA: Diagnosis not present

## 2019-04-07 DIAGNOSIS — S39012A Strain of muscle, fascia and tendon of lower back, initial encounter: Secondary | ICD-10-CM | POA: Diagnosis not present

## 2019-04-10 ENCOUNTER — Encounter: Payer: Self-pay | Admitting: Internal Medicine

## 2019-04-10 ENCOUNTER — Other Ambulatory Visit: Payer: Self-pay

## 2019-04-10 ENCOUNTER — Ambulatory Visit (INDEPENDENT_AMBULATORY_CARE_PROVIDER_SITE_OTHER): Payer: BC Managed Care – PPO | Admitting: Internal Medicine

## 2019-04-10 VITALS — HR 78 | Ht 66.0 in | Wt 131.0 lb

## 2019-04-10 DIAGNOSIS — S39012A Strain of muscle, fascia and tendon of lower back, initial encounter: Secondary | ICD-10-CM | POA: Diagnosis not present

## 2019-04-10 DIAGNOSIS — M5116 Intervertebral disc disorders with radiculopathy, lumbar region: Secondary | ICD-10-CM | POA: Diagnosis not present

## 2019-04-10 DIAGNOSIS — M5106 Intervertebral disc disorders with myelopathy, lumbar region: Secondary | ICD-10-CM | POA: Diagnosis not present

## 2019-04-10 DIAGNOSIS — E221 Hyperprolactinemia: Secondary | ICD-10-CM

## 2019-04-10 DIAGNOSIS — E039 Hypothyroidism, unspecified: Secondary | ICD-10-CM | POA: Diagnosis not present

## 2019-04-10 DIAGNOSIS — S338XXA Sprain of other parts of lumbar spine and pelvis, initial encounter: Secondary | ICD-10-CM | POA: Diagnosis not present

## 2019-04-10 LAB — TSH: TSH: 0.97 u[IU]/mL (ref 0.35–4.50)

## 2019-04-10 LAB — T4, FREE: Free T4: 0.95 ng/dL (ref 0.60–1.60)

## 2019-04-10 NOTE — Progress Notes (Signed)
Patient ID: Angela Foster, female   DOB: 1975/12/29, 43 y.o.   MRN: [id number]    HPI  Angela Foster is a 43 y.o.-year-old female, referred by her PCP, Scifres, Dorothy, PA-C, for management of hypothyroidism and elevated prolactin level.  Mild hypothyroidism Pt. was started on levothyroxine 50 mcg by Dr. Ronita Hipps in 07/2018 for borderline elevated TSH and hair loss. Patient is wondering if she should continue the medication.  She takes the thyroid hormone: - fasting - with water - separated by 15-30 min from b'fast  - no calcium, iron, PPIs, multivitamins  - Stopped Biotin  - 2 weeks ago (was taking a hair skin and nails vitamin).  I reviewed pt's thyroid tests: 01/06/2019: TSH 1.18 (0.4-4.0), free T4 1.39, free T3 3.1 07/19/2018: TSH 4.38 (0.45-4.5) 10/08/2016: TSH 1.43 No results found for: TSH, FREET4, T3FREE  Antithyroid antibodies: No results found for: THGAB No components found for: TPOAB  Pt denies: - weight gain - fatigue - heat intolerance - depression - constipation - dry skin  However, she continues to have hair loss, poor sleep and that she is getting cold in winter.  She also has chronic palpitations for which she takes metoprolol as needed.  Pt denies feeling nodules in neck, hoarseness, dysphagia/odynophagia, SOB with lying down.  She has + FH of thyroid disorders. No FH of thyroid cancer.  No h/o radiation tx to head or neck. No recent use of iodine supplements.  Brother has MMN, Multifocal muscular neuropathy. Daughetr with celiac. Another daughter with NF1.  She may have Raynad's ds.  - fingers while.  She also has a history of hyperprolactinemia: -Diagnosed in 2012, after her last child, as she had persistent galactorrhea.  Per review of records: 01/06/2019: Prolactin 27.9 (1.9-25) 07/19/2018: Prolactin 56.8 (4.8-28.3) 10/09/2016: Prolactin 5.69 (3.34-26.72) - on Cabergoline 0.25 biw No results found for: PROLACTIN   Of note, she had a  pituitary MRI (08/14/2018): Negative for pituitary tumor.  Mammogram and ultrasound of the left breast (06/09/2011) showed a 4 x 8 x 9 mm lymph node but no explanation for her high prolactin. She did She stopped Cabergoline 1 year ago.  She does not feel that this was helping.  No galactorrhea now.  No headaches.  Patient describes a lot of stress in her life recently.  She is separated from her husband and her daughter had brain surgery.  ROS: Constitutional: + See HPI Eyes: no blurry vision, no xerophthalmia ENT: no sore throat, + see HPI Cardiovascular: no CP/SOB/+ palpitations/no leg swelling Respiratory: no cough/SOB Gastrointestinal: no N/V/D/C Musculoskeletal: no muscle/joint aches Skin: no rashes, + hair loss Neurological: no tremors/numbness/tingling/dizziness Psychiatric: no depression/+ anxiety  No past medical history on file. No past surgical history on file.   Social History   Socioeconomic History  . Marital status: separated    Spouse name: Not on file  . Number of children: 3: 62, 22, 83 years old girls (03/2019)  . Years of education: Not on file  . 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 Financial resource strain: Not on file  . Food insecurity    Worry: Not on file    Inability: Not on file  . Transportation needs    Medical: Not on file    Non-medical: Not on file  Tobacco Use  . Smoking status: Never Smoker  . Smokeless tobacco: Never Used  Substance and Sexual Activity  .  Alcohol use:  Beer-1 drink twice a month  . Drug use: No  . Sexual activity: Not on file  Lifestyle  . Physical activity    Days per week: 5: Cycling, hiking, swimming    Minutes per session: Not on file   Current Outpatient Medications on File Prior to Visit  Medication Sig Dispense Refill  . beclomethasone (QVAR) 40 MCG/ACT inhaler Inhale 1 puff into the lungs 2 (two) times daily.    .  cholecalciferol (VITAMIN D) 1000 UNITS tablet Take 2,000 Units by mouth daily.    Marland Kitchen doxycycline (VIBRA-TABS) 100 MG tablet Take 1 tablet (100 mg total) by mouth 2 (two) times daily. (Patient not taking: Reported on 09/26/2014) 20 tablet 0  . FLUARIX QUADRIVALENT 0.5 ML injection TO BE ADMINISTERED BY PHARMACIST FOR IMMUNIZATION  0  . loratadine (CLARITIN) 10 MG tablet Take 10 mg by mouth daily.    . metoprolol tartrate (LOPRESSOR) 25 MG tablet 1/2-1 tablet po prn daily for palpitations. 30 tablet 6  . mupirocin ointment (BACTROBAN) 2 % Apply 1 application topically 3 (three) times daily. 30 g 0   No current facility-administered medications on file prior to visit.    No Known Allergies No family history on file.  PE: Pulse 78   Ht 5' 6"  (1.676 m)   Wt 131 lb (59.4 kg)   SpO2 99%   BMI 21.14 kg/m  Wt Readings from Last 3 Encounters:  04/10/19 131 lb (59.4 kg)  07/02/15 137 lb (62.1 kg)  09/26/14 137 lb (62.1 kg)   Constitutional: normal weight, in NAD Eyes: PERRLA, EOMI, no exophthalmos ENT: moist mucous membranes, no thyromegaly, no cervical lymphadenopathy Cardiovascular: RRR, No MRG Respiratory: CTA B Gastrointestinal: abdomen soft, NT, ND, BS+ Musculoskeletal: no deformities, strength intact in all 4 Skin: moist, warm, no rashes Neurological: no tremor with outstretched hands, DTR normal in all 4  ASSESSMENT: 1. Hypothyroidism  2. Hyperprolactinemia  PLAN:  1. Patient with long-standing hypothyroidism, on levothyroxine therapy.  She is on generic LT4 50 mcg daily.  She is wondering whether she absolutely needs to take this.  We discussed that her TSH was at the clearly abnormally she was started on this, and it is unclear whether she needs to take the medication.  Since she is not feeling better on it, we decided that we can try to decrease her levothyroxine dose and taper it to up.  However, we first need to check her for Hashimoto's thyroiditis. - she appears euthyroid  and has no hypothyroid complaints (except hair loss). - she does not appear to have a goiter, thyroid nodules, or neck compression symptoms - We discussed about correct intake of levothyroxine, fasting, with water, separated by at least 30 minutes from breakfast, and separated by more than 4 hours from calcium, iron, multivitamins, acid reflux medications (PPIs).  As of now, she is taking it close to breakfast and she will move this 30 minutes later - will check thyroid tests today: TSH, free T4 and will add TPO and ATA antibodies.  If all tests are normal, will decrease the dose to 25 mcg of levothyroxine daily for another 1.5 months and then try to stop.  If the antibodies are positive, we may try selenium. - Otherwise, I will see her back in 6 months  2. Hyperprolactinemia Patient with a high prolactin levels, and without evidence of a pituitary tumor.  - I discussed with the patient about possible etiologies of high prolactin levels:  Pregnancy - denies  Hypothyroidism, mild- this appears to be controlled  Stress   Exercise   Lack of sleep   Medications (she's not taking psychotropic medications or Reglan)  Drugs (denies)  Chest wall lesions (denies)  Seizures (denies)  Liver ds   Kidney disease   A teratoma containing pituitary cells that can develop into prolactinoma (very rarely)  Macroprolactin (multimeric prolactin, especially since patient does not have galactorrhea, but this can also rarely cause symptoms)  idiopathic - high prolactin most frequently impacts menstrual cycles - we will check monomeric and total prolactin today, along with TFTs - we discussed the possibility of restarting Cabergoline if prolactin is higher and a high level is not caused by macroprolactin. She agrees with the plan. - RTC in 6 months  CC: Dr. Ronita Hipps  Component     Latest Ref Rng & Units 04/10/2019  Prolactin, Total     ng/mL 26.0  Prolactin, Monomeric     3.2 - 25.2 ng/mL 20.4   TSH     0.35 - 4.50 uIU/mL 0.97  T4,Free(Direct)     0.60 - 1.60 ng/dL 0.95  Thyroglobulin Ab     < or = 1 IU/mL <1  Thyroperoxidase Ab SerPl-aCnc     <9 IU/mL <1   Thyroid labs are normal. She also does not have elevated thyroid antibodies.  I would suggest to try to decrease the levothyroxine to 25 mcg daily and recheck in 1.5 months. Her total and monomeric prolactin is normal.  I will recheck these at next visit.  No further intervention is needed for now.  Philemon Kingdom, MD PhD [facility] Endocrinology

## 2019-04-10 NOTE — Patient Instructions (Signed)
Please stop at the lab.  Try Magnesium 400-500 mg daily.  Please come back for a follow-up appointment in 6 months.

## 2019-04-11 DIAGNOSIS — S338XXA Sprain of other parts of lumbar spine and pelvis, initial encounter: Secondary | ICD-10-CM | POA: Diagnosis not present

## 2019-04-11 DIAGNOSIS — M5116 Intervertebral disc disorders with radiculopathy, lumbar region: Secondary | ICD-10-CM | POA: Diagnosis not present

## 2019-04-11 DIAGNOSIS — M5106 Intervertebral disc disorders with myelopathy, lumbar region: Secondary | ICD-10-CM | POA: Diagnosis not present

## 2019-04-11 DIAGNOSIS — S39012A Strain of muscle, fascia and tendon of lower back, initial encounter: Secondary | ICD-10-CM | POA: Diagnosis not present

## 2019-04-13 LAB — PROLACTIN, TOTAL AND MONOMERIC
Prolactin, Monomeric: 20.4 ng/mL (ref 3.2–25.2)
Prolactin, Total: 26 ng/mL

## 2019-04-13 LAB — THYROGLOBULIN ANTIBODY: Thyroglobulin Ab: <1 IU/mL (ref <=1)

## 2019-04-13 LAB — THYROID PEROXIDASE ANTIBODY: Thyroperoxidase Ab SerPl-aCnc: <1 IU/mL (ref <9)

## 2019-04-14 ENCOUNTER — Encounter: Payer: Self-pay | Admitting: Internal Medicine

## 2019-04-14 MED ORDER — LEVOTHYROXINE SODIUM 25 MCG PO TABS: 25.0000 ug | ORAL_TABLET | Freq: Every day | ORAL | 3 refills | Status: DC

## 2019-04-19 ENCOUNTER — Encounter: Payer: Self-pay | Admitting: Internal Medicine

## 2019-05-09 DIAGNOSIS — M5416 Radiculopathy, lumbar region: Secondary | ICD-10-CM | POA: Diagnosis not present

## 2019-05-09 DIAGNOSIS — M545 Low back pain: Secondary | ICD-10-CM | POA: Diagnosis not present

## 2019-06-07 ENCOUNTER — Other Ambulatory Visit (INDEPENDENT_AMBULATORY_CARE_PROVIDER_SITE_OTHER): Payer: BC Managed Care – PPO

## 2019-06-07 ENCOUNTER — Other Ambulatory Visit: Payer: Self-pay

## 2019-06-07 DIAGNOSIS — E039 Hypothyroidism, unspecified: Secondary | ICD-10-CM

## 2019-06-09 ENCOUNTER — Other Ambulatory Visit: Payer: Self-pay | Admitting: Internal Medicine

## 2019-06-09 DIAGNOSIS — E039 Hypothyroidism, unspecified: Secondary | ICD-10-CM

## 2019-06-09 LAB — T4, FREE: Free T4: 0.78 ng/dL (ref 0.60–1.60)

## 2019-06-09 LAB — TSH: TSH: 1.16 u[IU]/mL (ref 0.35–4.50)

## 2019-06-13 ENCOUNTER — Encounter: Payer: Self-pay | Admitting: Internal Medicine

## 2019-07-20 DIAGNOSIS — Z1151 Encounter for screening for human papillomavirus (HPV): Secondary | ICD-10-CM | POA: Diagnosis not present

## 2019-07-20 DIAGNOSIS — Z6820 Body mass index (BMI) 20.0-20.9, adult: Secondary | ICD-10-CM | POA: Diagnosis not present

## 2019-07-20 DIAGNOSIS — Z1231 Encounter for screening mammogram for malignant neoplasm of breast: Secondary | ICD-10-CM | POA: Diagnosis not present

## 2019-07-20 DIAGNOSIS — Z01419 Encounter for gynecological examination (general) (routine) without abnormal findings: Secondary | ICD-10-CM | POA: Diagnosis not present

## 2019-08-09 ENCOUNTER — Other Ambulatory Visit: Payer: Self-pay

## 2019-08-09 ENCOUNTER — Other Ambulatory Visit (INDEPENDENT_AMBULATORY_CARE_PROVIDER_SITE_OTHER): Payer: BC Managed Care – PPO

## 2019-08-09 DIAGNOSIS — E039 Hypothyroidism, unspecified: Secondary | ICD-10-CM | POA: Diagnosis not present

## 2019-08-09 LAB — T3, FREE: T3, Free: 3.5 pg/mL (ref 2.3–4.2)

## 2019-08-09 LAB — TSH: TSH: 1.86 u[IU]/mL (ref 0.35–4.50)

## 2019-08-09 LAB — T4, FREE: Free T4: 0.74 ng/dL (ref 0.60–1.60)

## 2019-08-13 ENCOUNTER — Encounter: Payer: Self-pay | Admitting: Internal Medicine

## 2019-09-13 DIAGNOSIS — Z0000 Encounter for general adult medical examination without abnormal findings: Secondary | ICD-10-CM | POA: Diagnosis not present

## 2019-09-13 DIAGNOSIS — R002 Palpitations: Secondary | ICD-10-CM | POA: Diagnosis not present

## 2019-10-09 ENCOUNTER — Encounter: Payer: Self-pay | Admitting: Internal Medicine

## 2019-10-09 ENCOUNTER — Ambulatory Visit (INDEPENDENT_AMBULATORY_CARE_PROVIDER_SITE_OTHER): Payer: BC Managed Care – PPO | Admitting: Internal Medicine

## 2019-10-09 ENCOUNTER — Other Ambulatory Visit: Payer: Self-pay

## 2019-10-09 VITALS — BP 110/60 | HR 57 | Ht 66.0 in | Wt 135.0 lb

## 2019-10-09 DIAGNOSIS — E039 Hypothyroidism, unspecified: Secondary | ICD-10-CM

## 2019-10-09 DIAGNOSIS — E221 Hyperprolactinemia: Secondary | ICD-10-CM | POA: Diagnosis not present

## 2019-10-09 NOTE — Patient Instructions (Addendum)
Please stop at the lab.  Please come back as needed.

## 2019-10-09 NOTE — Progress Notes (Signed)
Patient ID: Angela Foster, female   DOB: Jul 26, 1976, 44 y.o.   MRN: [id number]   This visit occurred during the SARS-CoV-2 public health emergency.  Safety protocols were in place, including screening questions prior to the visit, additional usage of staff PPE, and extensive cleaning of exam room while observing appropriate contact time as indicated for disinfecting solutions.   HPI  Angela Foster is a 44 y.o.-year-old female, initially referred by her PCP, Scifres, Dorothy, PA-C, returning for follow-up for history of hypothyroidism and elevated prolactin level.  Last visit 6 months ago.  Mild hypothyroidism Pt. was started on levothyroxine 50 mcg by Dr. Ronita Hipps in 07/2018 for borderline elevated TSH and hair loss.  At last visit she was wondering whether she absolutely need the medication, since she did not feel a difference on it.  We decreased the dose gradually and we were able to stop in 06/2019.  Subsequent labs are normal.  I reviewed her TFTs: Lab Results  Component Value Date   TSH 1.86 08/09/2019   TSH 1.16 06/07/2019   TSH 0.97 04/10/2019   FREET4 0.74 08/09/2019   FREET4 0.78 06/07/2019   FREET4 0.95 04/10/2019   T3FREE 3.5 08/09/2019  01/06/2019: TSH 1.18 (0.4-4.0), free T4 1.39, free T3 3.1 07/19/2018: TSH 4.38 (0.45-4.5) 10/08/2016: TSH 1.43  Her antithyroid antibodies were not elevated: Component     Latest Ref Rng & Units 04/10/2019  Thyroglobulin Ab     < or = 1 IU/mL <1  Thyroperoxidase Ab SerPl-aCnc     <9 IU/mL <1   Patient denies: - Weight gain - Fatigue - Cold intolerance - Constipation But she does hair loss, which is chronic.  She also has poor sleep and having cold intolerance during winter.  She also has palpitations for which she takes metoprolol as needed.  Pt denies: - feeling nodules in neck - hoarseness - dysphagia - choking - SOB with lying down  She has + FH of thyroid disorders. No FH of thyroid cancer. No h/o radiation tx to head  or neck.  No seaweed or kelp. No recent contrast studies. No herbal supplements. No Biotin use. No recent steroids use.   Brother has MMN, Multifocal muscular neuropathy. Daughetr with celiac. Another daughter with NF1.  She may have Raynad's ds.  - fingers while.  Now on [place], changed from [place].  She also has a history of hyperprolactinemia: -Diagnosed in 2012, after her last child, and she had persistent galactorrhea.  At last visit we checked a total and monomeric prolactin and they were normal:  Component     Latest Ref Rng & Units 04/10/2019  Prolactin, Total     ng/mL 26.0  Prolactin, Monomeric     3.2 - 25.2 ng/mL 20.4   Per review of previous records: 01/06/2019: Prolactin 27.9 (1.9-25) 07/19/2018: Prolactin 56.8 (4.8-28.3) 10/09/2016: Prolactin 5.69 (3.34-26.72) - on Cabergoline 0.25 biw No results found for: PROLACTIN   Pituitary MRI (08/14/2018): Negative for pituitary tumor.  Mammogram and ultrasound of the left breast (06/09/2011) showed a 4 x 8 x 9 mm lymph node but no explanation for her high prolactin. She did She stopped cabergoline summer 2019.  She did not feel that this was helping. She denies galactorrhea or headaches.  Patient describes a lot of stress in her life recently.  She is separated from her husband and her daughter had brain surgery.  ROS: Constitutional: no weight gain/no weight loss, no fatigue, no subjective hyperthermia, no subjective hypothermia Eyes: no blurry vision,  no xerophthalmia ENT: no sore throat, + see HPI Cardiovascular: no CP/no SOB/+ palpitations/no leg swelling Respiratory: no cough/no SOB/no wheezing Gastrointestinal: no N/no V/no D/no C/no acid reflux Musculoskeletal: no muscle aches/no joint aches Skin: no rashes, + hair loss Neurological: no tremors/no numbness/no tingling/no dizziness  I reviewed pt's medications, allergies, PMH, social hx, family hx, and changes were documented in the history of present illness.  Otherwise, unchanged from my initial visit note.  PMH: -Resolved hypothyroidism Patient Active Problem List   Diagnosis Date Noted  . Left anterior knee pain 04/19/2013  . Abnormality of gait 04/19/2013   No past surgical history on file.   Social History   Socioeconomic History  . Marital status: separated    Spouse name: Not on file  . Number of children: 3: 27, 109, 52 years old girls (03/2019)  . Years of education: Not on file  . 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 Financial resource strain: Not on file  . Food insecurity    Worry: Not on file    Inability: Not on file  . Transportation needs    Medical: Not on file    Non-medical: Not on file  Tobacco Use  . Smoking status: Never Smoker  . Smokeless tobacco: Never Used  Substance and Sexual Activity  . Alcohol use:  Beer-1 drink twice a month  . Drug use: No  . Sexual activity: Not on file  Lifestyle  . Physical activity    Days per week: 5: Cycling, hiking, swimming    Minutes per session: Not on file   Current Outpatient Medications on File Prior to Visit  Medication Sig Dispense Refill  . cholecalciferol (VITAMIN D) 1000 UNITS tablet Take 2,000 Units by mouth daily.    Marland Kitchen KARIVA 0.15-0.02/0.01 MG (21/5) tablet Take 1 tablet by mouth daily.     No current facility-administered medications on file prior to visit.   No Known Allergies No family history on file.  PE: BP 110/60   Pulse (!) 57   Ht 5' 6"  (1.676 m)   Wt 135 lb (61.2 kg)   SpO2 99%   BMI 21.79 kg/m  Wt Readings from Last 3 Encounters:  10/09/19 135 lb (61.2 kg)  04/10/19 131 lb (59.4 kg)  07/02/15 137 lb (62.1 kg)   Constitutional: normal weight, in NAD Eyes: PERRLA, EOMI, no exophthalmos ENT: moist mucous membranes, no thyromegaly, no cervical lymphadenopathy Cardiovascular: RRR, No MRG Respiratory: CTA B Gastrointestinal: abdomen soft, NT, ND,  BS+ Musculoskeletal: no deformities, strength intact in all 4 Skin: moist, warm, no rashes Neurological: no tremor with outstretched hands, DTR normal in all 4  ASSESSMENT: 1. Hypothyroidism  2. Hyperprolactinemia  PLAN:  1. Patient with history of mild hypothyroidism previously on levothyroxine therapy which 50 mcg daily.  At last visit she did not feel different on the medication and was wondering whether she absolutely needed it.  We started to decrease the dose and she was able to come off the medication in 06/2019.  Next TFTs were normal in 07/2019. -We reviewed her TPO and ATA antibodies obtained at last visit and these were normal, so she has no signs of Hashimoto's thyroiditis -No hypothyroid symptoms except hair loss, which is chronic -At this visit we will recheck her TFTs and if these are normal, she does not need to follow-up with me, but had a TSH checked yearly  2.  Hyperprolactinemia -Patient with a high prolactin level in the past and without evidence of a pituitary tumor -At last visit we discussed about possible etiologies of high prolactin levels:  Pregnancy -denies  Hypothyroidism, mild-now off levothyroxine with normal TFTs  Stress   Exercise   Lack of sleep   Medications-not on psychotropic medications are regular  Drugs-denies  Chest wall lesions-denies  Seizures-denies  Liver ds   Kidney disease   A teratoma containing pituitary cells that can develop into prolactinoma (very rarely)  Macroprolactin -we checked this at last visit and she did not have a macroprolactin  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  CC: Dr. Ronita Hipps  Component     Latest Ref Rng & Units 10/09/2019  TSH     0.35 - 4.50 uIU/mL 1.81  T4,Free(Direct)     0.60 - 1.60 ng/dL 0.71  Triiodothyronine,Free,Serum     2.3 - 4.2 pg/mL 3.6   Prolactin     4.8 - 23.3 ng/mL 39.2 (H)  TFTs remain normal, but prolactin returned high at this time.  This could be related to her OCPs (she changed her OCP since last visit).  We will repeat her prolactin level in 6 months, but no intervention is needed for now.  Philemon Kingdom, MD PhD [facility] Endocrinology

## 2019-10-10 LAB — TSH: TSH: 1.81 u[IU]/mL (ref 0.35–4.50)

## 2019-10-10 LAB — T3, FREE: T3, Free: 3.6 pg/mL (ref 2.3–4.2)

## 2019-10-10 LAB — PROLACTIN: Prolactin: 39.2 ng/mL — ABNORMAL HIGH (ref 4.8–23.3)

## 2019-10-10 LAB — T4, FREE: Free T4: 0.71 ng/dL (ref 0.60–1.60)

## 2019-10-11 ENCOUNTER — Other Ambulatory Visit: Payer: Self-pay | Admitting: Internal Medicine

## 2019-10-11 ENCOUNTER — Encounter: Payer: Self-pay | Admitting: Internal Medicine

## 2019-10-11 DIAGNOSIS — E221 Hyperprolactinemia: Secondary | ICD-10-CM

## 2019-10-18 DIAGNOSIS — I10 Essential (primary) hypertension: Secondary | ICD-10-CM | POA: Diagnosis not present

## 2019-10-18 DIAGNOSIS — Z6822 Body mass index (BMI) 22.0-22.9, adult: Secondary | ICD-10-CM | POA: Diagnosis not present

## 2019-10-18 DIAGNOSIS — G43809 Other migraine, not intractable, without status migrainosus: Secondary | ICD-10-CM | POA: Diagnosis not present

## 2019-11-03 DIAGNOSIS — R001 Bradycardia, unspecified: Secondary | ICD-10-CM | POA: Diagnosis not present

## 2019-11-03 DIAGNOSIS — N926 Irregular menstruation, unspecified: Secondary | ICD-10-CM | POA: Diagnosis not present

## 2019-11-30 DIAGNOSIS — M9905 Segmental and somatic dysfunction of pelvic region: Secondary | ICD-10-CM | POA: Diagnosis not present

## 2019-11-30 DIAGNOSIS — M5402 Panniculitis affecting regions of neck and back, cervical region: Secondary | ICD-10-CM | POA: Diagnosis not present

## 2019-11-30 DIAGNOSIS — M9903 Segmental and somatic dysfunction of lumbar region: Secondary | ICD-10-CM | POA: Diagnosis not present

## 2019-11-30 DIAGNOSIS — M9901 Segmental and somatic dysfunction of cervical region: Secondary | ICD-10-CM | POA: Diagnosis not present

## 2019-12-19 DIAGNOSIS — Z20828 Contact with and (suspected) exposure to other viral communicable diseases: Secondary | ICD-10-CM | POA: Diagnosis not present

## 2020-02-19 DIAGNOSIS — R7401 Elevation of levels of liver transaminase levels: Secondary | ICD-10-CM | POA: Diagnosis not present

## 2020-02-19 DIAGNOSIS — Z9886 Personal history of breast implant removal: Secondary | ICD-10-CM | POA: Diagnosis not present

## 2020-02-19 DIAGNOSIS — G43709 Chronic migraine without aura, not intractable, without status migrainosus: Secondary | ICD-10-CM | POA: Diagnosis not present

## 2020-02-19 DIAGNOSIS — R002 Palpitations: Secondary | ICD-10-CM | POA: Diagnosis not present

## 2020-02-23 DIAGNOSIS — M9903 Segmental and somatic dysfunction of lumbar region: Secondary | ICD-10-CM | POA: Diagnosis not present

## 2020-02-23 DIAGNOSIS — M9901 Segmental and somatic dysfunction of cervical region: Secondary | ICD-10-CM | POA: Diagnosis not present

## 2020-02-23 DIAGNOSIS — M9905 Segmental and somatic dysfunction of pelvic region: Secondary | ICD-10-CM | POA: Diagnosis not present

## 2020-02-23 DIAGNOSIS — M5402 Panniculitis affecting regions of neck and back, cervical region: Secondary | ICD-10-CM | POA: Diagnosis not present

## 2020-03-04 DIAGNOSIS — R002 Palpitations: Secondary | ICD-10-CM | POA: Diagnosis not present

## 2020-03-04 DIAGNOSIS — G43709 Chronic migraine without aura, not intractable, without status migrainosus: Secondary | ICD-10-CM | POA: Diagnosis not present

## 2020-03-04 DIAGNOSIS — R7401 Elevation of levels of liver transaminase levels: Secondary | ICD-10-CM | POA: Diagnosis not present

## 2020-03-04 DIAGNOSIS — Z9886 Personal history of breast implant removal: Secondary | ICD-10-CM | POA: Diagnosis not present

## 2020-03-11 DIAGNOSIS — R519 Headache, unspecified: Secondary | ICD-10-CM | POA: Diagnosis not present

## 2020-03-11 DIAGNOSIS — Z6821 Body mass index (BMI) 21.0-21.9, adult: Secondary | ICD-10-CM | POA: Diagnosis not present

## 2020-03-11 DIAGNOSIS — Z0001 Encounter for general adult medical examination with abnormal findings: Secondary | ICD-10-CM | POA: Diagnosis not present

## 2020-03-11 DIAGNOSIS — R002 Palpitations: Secondary | ICD-10-CM | POA: Diagnosis not present

## 2020-03-11 IMAGING — MR MR HEAD WO/W CM
14 of 19 series · 32 of 48 positions shown · IV contrast (7ml multihance)
Comparison: None.

CLINICAL DATA: 42 y/o  F; elevated prolactin level.

EXAM:
MRI HEAD WITHOUT AND WITH CONTRAST
TECHNIQUE: Multiplanar, multiecho pulse sequences of the brain and surrounding
structures were obtained without and with intravenous contrast.
Pituitary protocol.
CONTRAST:  7mL MULTIHANCE GADOBENATE DIMEGLUMINE 529 MG/ML IV SOLN

[Series 2: T1 · sagittal · 5.0mm · 0.45mm/px · 3 of 21 slices shown]
[im 1/21]
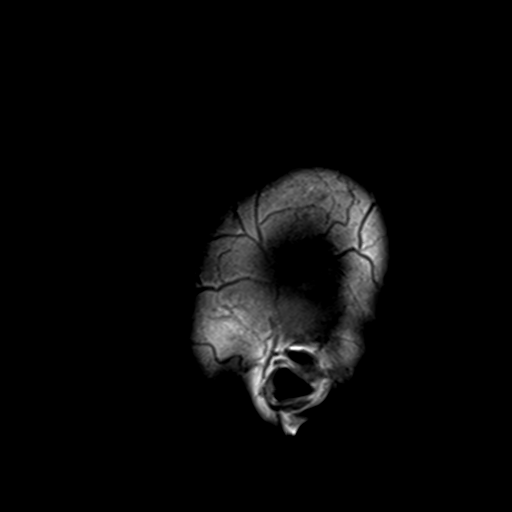
[im 11/21]
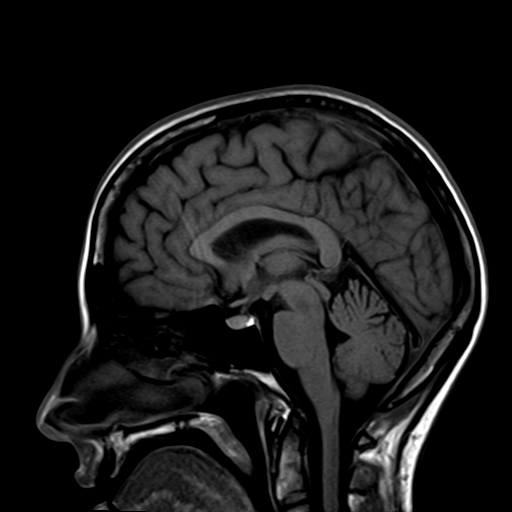
[im 21/21]
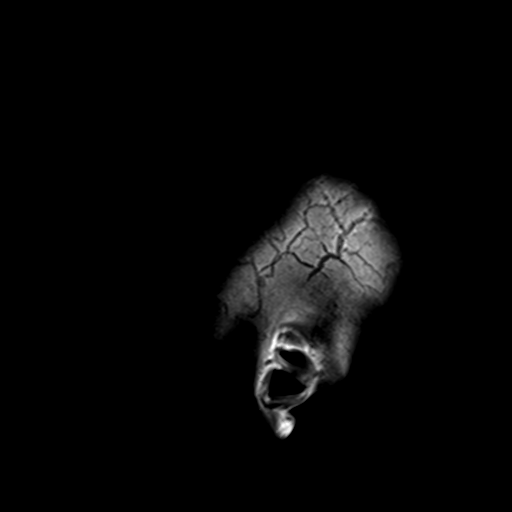

[Series 3: DWI · axial · 3.0mm · 1.80mm/px · z∈[-52,+95]mm · 8 of 100 slices shown]
[im 1/100]
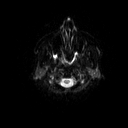
[im 12/100]
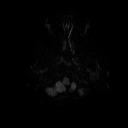
[im 34/100]
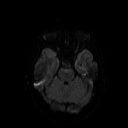
[im 45/100]
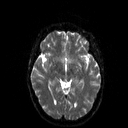
[im 56/100]
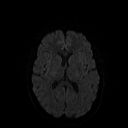
[im 67/100]
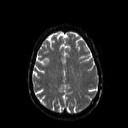
[im 89/100]
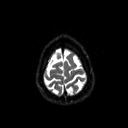
[im 100/100]
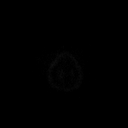

[Series 4: dwi_adc · axial · 3.0mm · 1.80mm/px · z∈[-52,+95]mm · 5 of 48 slices shown]
[im 1/48]
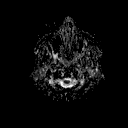
[im 12/48]
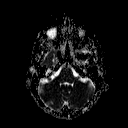
[im 24/48]
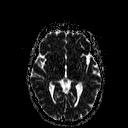
[im 36/48]
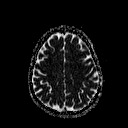
[im 48/48]
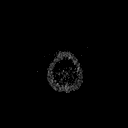

[Series 5: T2 · axial · 5.0mm · 0.36mm/px · z∈[-46,+90]mm · 2 of 22 slices shown]
[im 1/22]
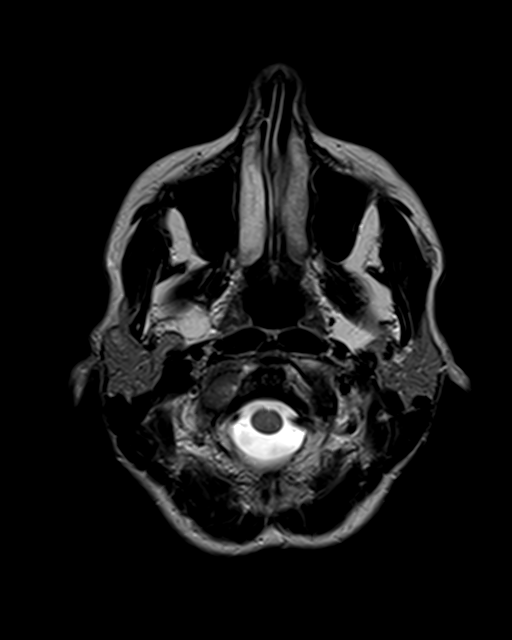
[im 22/22]
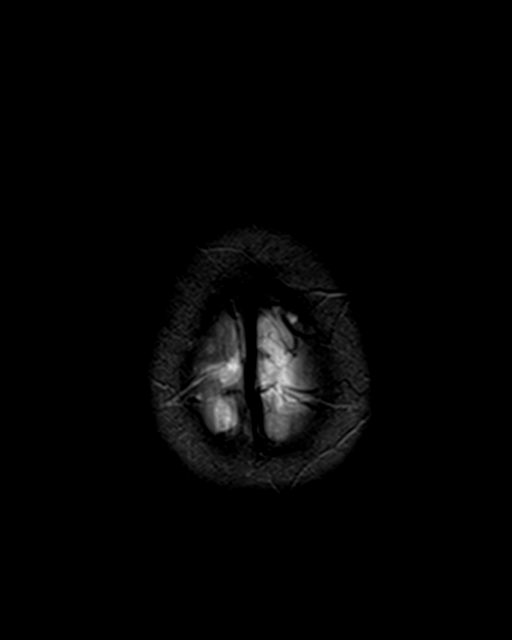

[Series 6: FLAIR · axial · 3.0mm · 0.45mm/px · z∈[-51,+93]mm · 3 of 32 slices shown]
[im 1/32]
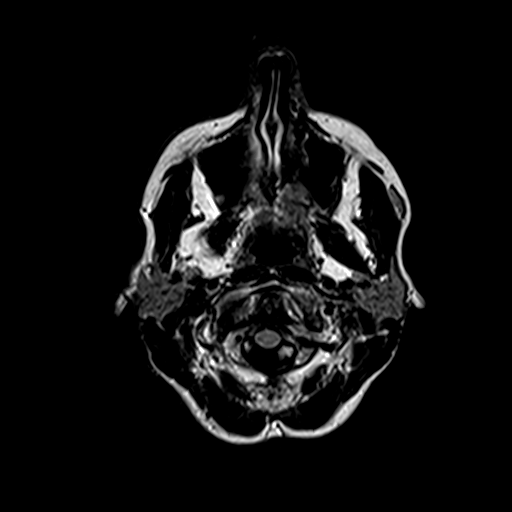
[im 16/32]
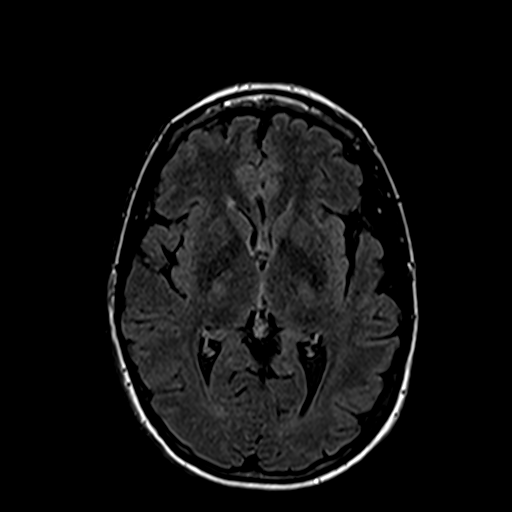
[im 32/32]
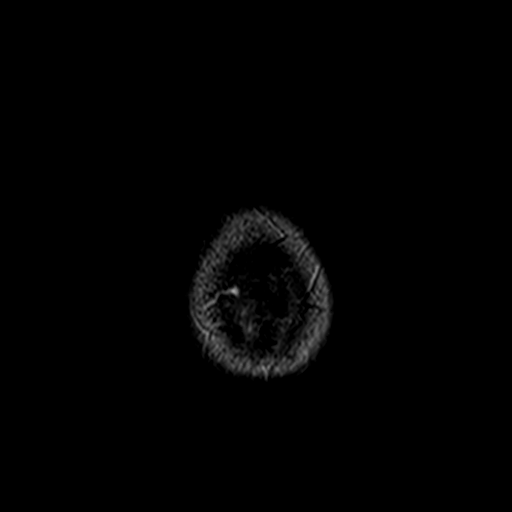

[Series 7: axial grad (blood) · axial · 5.0mm · 0.45mm/px · z∈[-52,+97]mm · 3 of 24 slices shown]
[im 1/24]
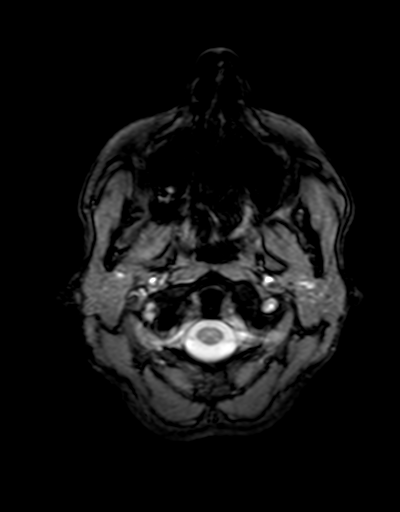
[im 12/24]
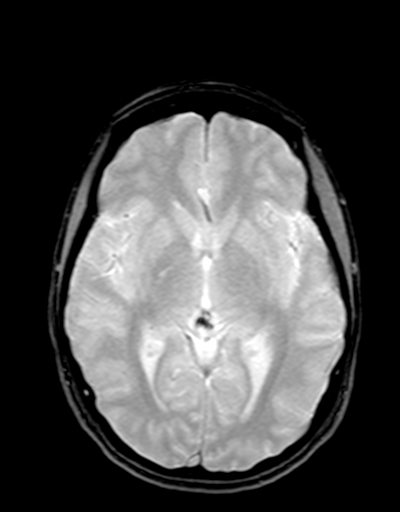
[im 24/24]
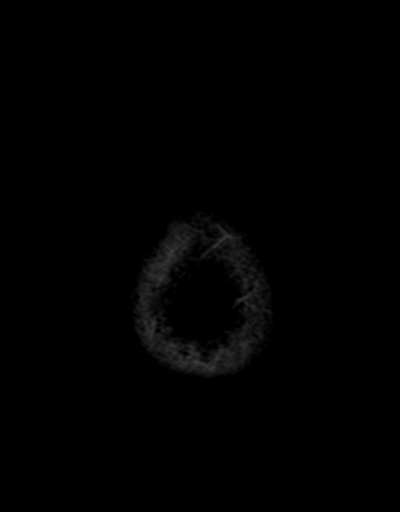

[Series 8: sag 3mm · sagittal · 3.0mm · 0.33mm/px · 1 of 11 slices shown]
[im 1/11]
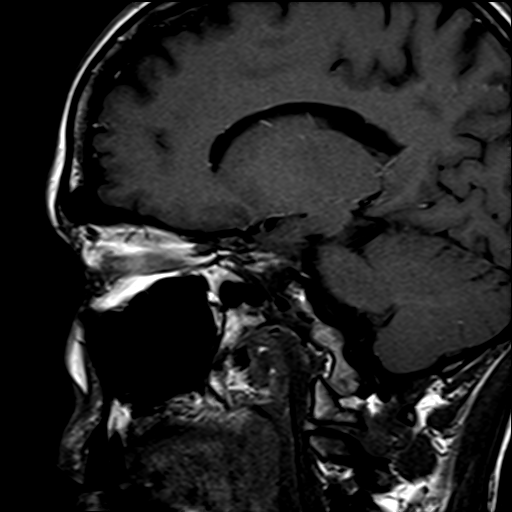

[Series 9: cor 3mm · coronal · 3.0mm · 0.33mm/px · 1 of 11 slices shown]
[im 1/11]
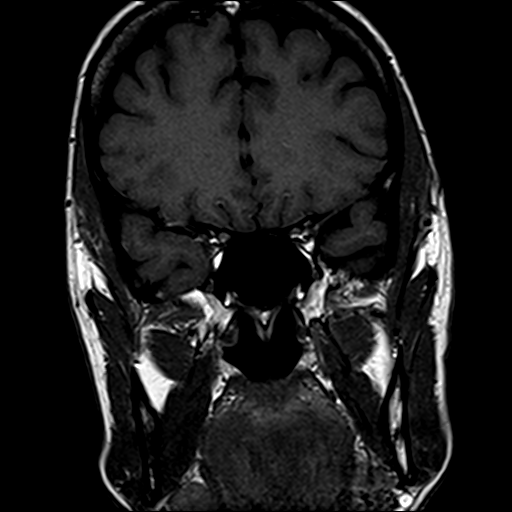

[Series 10: pre cor dynamic · coronal · non-contrast · 3.0mm · 0.35mm/px · 1 of 10 slices shown]
[im 1/10]
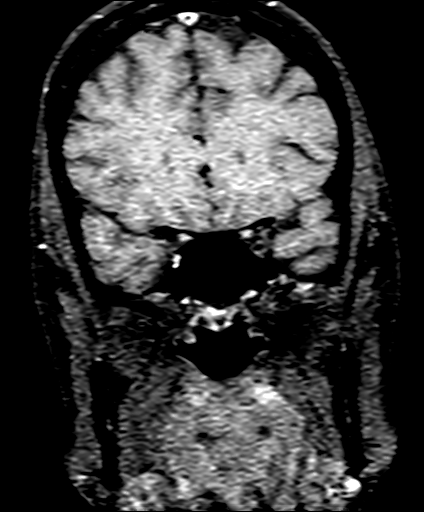

[Series 11: post fs cor · coronal · 3.0mm · 0.35mm/px · 1 of 10 slices shown (1 of 5)]
[im 1/10]
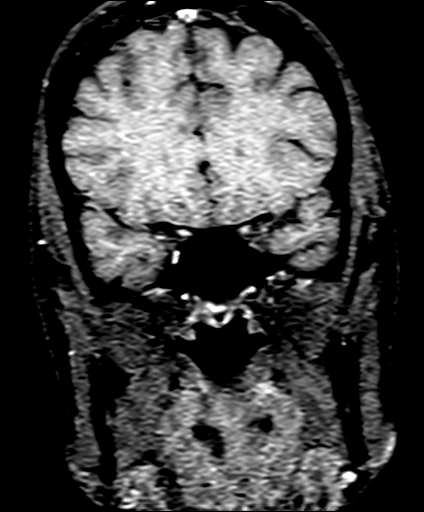

[Series 12: post fs cor · coronal · 3.0mm · 0.35mm/px · 1 of 10 slices shown (2 of 5)]
[im 1/10]
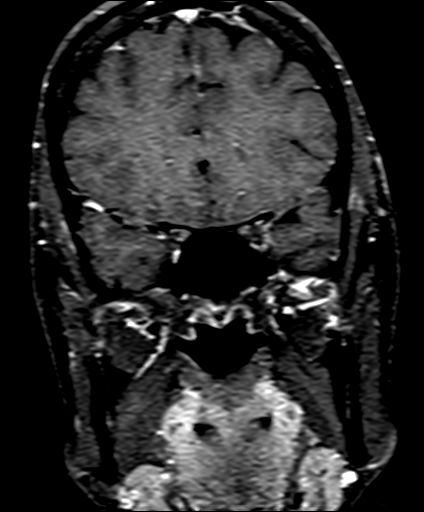

[Series 13: post fs cor · coronal · 3.0mm · 0.35mm/px · 1 of 10 slices shown (3 of 5)]
[im 1/10]
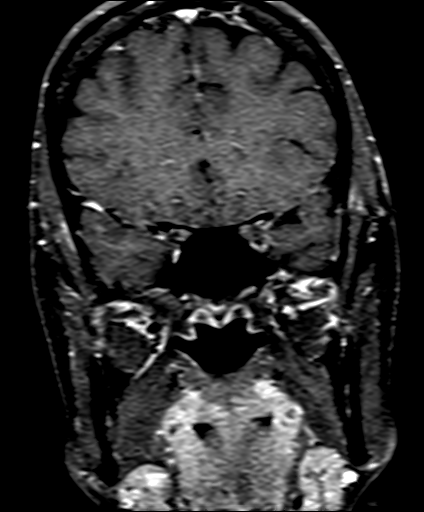

[Series 14: post fs cor · coronal · 3.0mm · 0.35mm/px · 1 of 10 slices shown (4 of 5)]
[im 1/10]
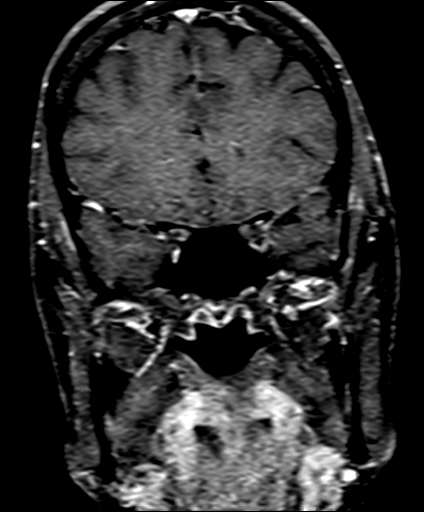

[Series 15: post fs cor · coronal · 3.0mm · 0.35mm/px · 1 of 10 slices shown (5 of 5)]
[im 1/10]
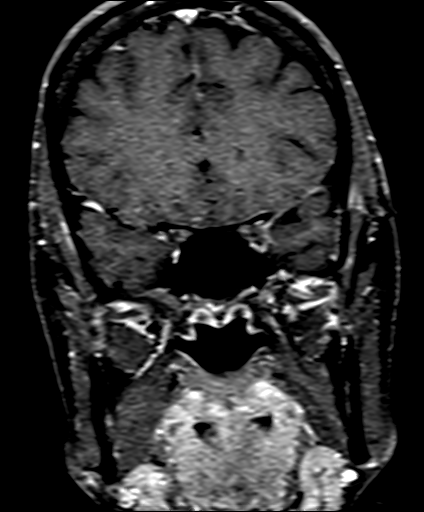

[32 of 48 positions shown; findings below may reference images not displayed]

FINDINGS: Pituitary: No pituitary mass. Tiny 1-2 mm pars intermedius cyst with
T1 shortening indicating proteinaceous components.

Brain: No acute infarction, hemorrhage, hydrocephalus, extra-axial
collection or mass lesion. Single punctate focus of T2 FLAIR
hyperintense signal abnormality in right frontal white matter of
unlikely clinical significance given age.

Vascular: Normal flow voids.

Skull and upper cervical spine: Normal marrow signal.

Sinuses/Orbits: Negative.

Other: None.
IMPRESSION: Negative for pituitary mass.  Unremarkable MRI of the brain.

## 2020-03-22 DIAGNOSIS — M5416 Radiculopathy, lumbar region: Secondary | ICD-10-CM | POA: Diagnosis not present

## 2020-04-24 DIAGNOSIS — M5126 Other intervertebral disc displacement, lumbar region: Secondary | ICD-10-CM | POA: Diagnosis not present

## 2020-04-24 DIAGNOSIS — M545 Low back pain: Secondary | ICD-10-CM | POA: Diagnosis not present

## 2020-04-30 DIAGNOSIS — M545 Low back pain: Secondary | ICD-10-CM | POA: Diagnosis not present

## 2020-04-30 DIAGNOSIS — M542 Cervicalgia: Secondary | ICD-10-CM | POA: Diagnosis not present

## 2020-04-30 DIAGNOSIS — M5416 Radiculopathy, lumbar region: Secondary | ICD-10-CM | POA: Diagnosis not present

## 2020-05-11 DIAGNOSIS — S0181XA Laceration without foreign body of other part of head, initial encounter: Secondary | ICD-10-CM | POA: Diagnosis not present

## 2020-05-12 DIAGNOSIS — S0181XA Laceration without foreign body of other part of head, initial encounter: Secondary | ICD-10-CM | POA: Diagnosis not present

## 2020-05-14 DIAGNOSIS — R002 Palpitations: Secondary | ICD-10-CM | POA: Diagnosis not present

## 2020-05-14 DIAGNOSIS — R0789 Other chest pain: Secondary | ICD-10-CM | POA: Diagnosis not present

## 2020-05-14 DIAGNOSIS — U071 COVID-19: Secondary | ICD-10-CM | POA: Diagnosis not present

## 2020-06-03 DIAGNOSIS — R002 Palpitations: Secondary | ICD-10-CM | POA: Diagnosis not present

## 2020-06-06 DIAGNOSIS — R0789 Other chest pain: Secondary | ICD-10-CM | POA: Diagnosis not present

## 2020-07-01 DIAGNOSIS — I340 Nonrheumatic mitral (valve) insufficiency: Secondary | ICD-10-CM | POA: Diagnosis not present

## 2020-07-01 DIAGNOSIS — R0789 Other chest pain: Secondary | ICD-10-CM | POA: Diagnosis not present

## 2020-07-11 DIAGNOSIS — I340 Nonrheumatic mitral (valve) insufficiency: Secondary | ICD-10-CM | POA: Diagnosis not present

## 2020-07-11 DIAGNOSIS — U071 COVID-19: Secondary | ICD-10-CM | POA: Diagnosis not present

## 2020-07-11 DIAGNOSIS — R002 Palpitations: Secondary | ICD-10-CM | POA: Diagnosis not present

## 2020-07-11 DIAGNOSIS — R0789 Other chest pain: Secondary | ICD-10-CM | POA: Diagnosis not present

## 2020-07-18 DIAGNOSIS — R410 Disorientation, unspecified: Secondary | ICD-10-CM | POA: Diagnosis not present

## 2020-07-18 DIAGNOSIS — R413 Other amnesia: Secondary | ICD-10-CM | POA: Diagnosis not present

## 2020-07-18 DIAGNOSIS — G43909 Migraine, unspecified, not intractable, without status migrainosus: Secondary | ICD-10-CM | POA: Diagnosis not present

## 2020-07-18 DIAGNOSIS — R42 Dizziness and giddiness: Secondary | ICD-10-CM | POA: Diagnosis not present

## 2020-08-14 DIAGNOSIS — Z1231 Encounter for screening mammogram for malignant neoplasm of breast: Secondary | ICD-10-CM | POA: Diagnosis not present

## 2020-08-14 DIAGNOSIS — Z01419 Encounter for gynecological examination (general) (routine) without abnormal findings: Secondary | ICD-10-CM | POA: Diagnosis not present

## 2020-08-14 DIAGNOSIS — Z124 Encounter for screening for malignant neoplasm of cervix: Secondary | ICD-10-CM | POA: Diagnosis not present

## 2020-08-14 DIAGNOSIS — Z6821 Body mass index (BMI) 21.0-21.9, adult: Secondary | ICD-10-CM | POA: Diagnosis not present

## 2020-09-12 DIAGNOSIS — M5126 Other intervertebral disc displacement, lumbar region: Secondary | ICD-10-CM | POA: Diagnosis not present

## 2020-09-20 DIAGNOSIS — M5126 Other intervertebral disc displacement, lumbar region: Secondary | ICD-10-CM | POA: Diagnosis not present

## 2020-10-03 DIAGNOSIS — M542 Cervicalgia: Secondary | ICD-10-CM | POA: Diagnosis not present

## 2020-10-03 DIAGNOSIS — M792 Neuralgia and neuritis, unspecified: Secondary | ICD-10-CM | POA: Diagnosis not present

## 2020-10-09 DIAGNOSIS — G4700 Insomnia, unspecified: Secondary | ICD-10-CM | POA: Diagnosis not present

## 2020-10-09 DIAGNOSIS — F419 Anxiety disorder, unspecified: Secondary | ICD-10-CM | POA: Diagnosis not present

## 2020-10-09 DIAGNOSIS — E039 Hypothyroidism, unspecified: Secondary | ICD-10-CM | POA: Diagnosis not present

## 2020-10-09 DIAGNOSIS — I493 Ventricular premature depolarization: Secondary | ICD-10-CM | POA: Diagnosis not present

## 2020-10-09 DIAGNOSIS — Z23 Encounter for immunization: Secondary | ICD-10-CM | POA: Diagnosis not present

## 2021-02-04 DIAGNOSIS — N921 Excessive and frequent menstruation with irregular cycle: Secondary | ICD-10-CM | POA: Diagnosis not present

## 2021-05-07 DIAGNOSIS — I83813 Varicose veins of bilateral lower extremities with pain: Secondary | ICD-10-CM | POA: Diagnosis not present

## 2021-05-07 DIAGNOSIS — I8312 Varicose veins of left lower extremity with inflammation: Secondary | ICD-10-CM | POA: Diagnosis not present

## 2021-05-07 DIAGNOSIS — I8311 Varicose veins of right lower extremity with inflammation: Secondary | ICD-10-CM | POA: Diagnosis not present

## 2021-05-07 DIAGNOSIS — I83893 Varicose veins of bilateral lower extremities with other complications: Secondary | ICD-10-CM | POA: Diagnosis not present

## 2021-06-03 ENCOUNTER — Ambulatory Visit (INDEPENDENT_AMBULATORY_CARE_PROVIDER_SITE_OTHER): Payer: BC Managed Care – PPO | Admitting: Internal Medicine

## 2021-06-03 ENCOUNTER — Other Ambulatory Visit: Payer: Self-pay

## 2021-06-03 ENCOUNTER — Encounter: Payer: Self-pay | Admitting: Internal Medicine

## 2021-06-03 VITALS — BP 136/80 | HR 64 | Ht 66.0 in | Wt 137.0 lb

## 2021-06-03 DIAGNOSIS — Z8249 Family history of ischemic heart disease and other diseases of the circulatory system: Secondary | ICD-10-CM

## 2021-06-03 NOTE — Progress Notes (Signed)
ELECTROPHYSIOLOGY office  NOTE  Patient ID: Angela Foster, MRN: [id number], DOB/AGE: 45-May-1977 45 y.o. Admit date: (Not on file) Date of Consult: 06/03/2021  Primary Physician: Maude Leriche, PA-C Primary Cardiologist:       Veida Spira is a 45 y.o. female who is being seen today for the evaluation of possible HCM      HPI Angela Foster is a 45 y.o. female seen as the daguther of Angela Foster,.  Recently identified as a proband with apical HCM.  He has declined genetic testing    Echocardiogram 6/12 was normal  Event recorder 6/12 was reviewed.  Described is about 1000 PVCs; however, many are associated with no compensatory pause in the initial deflection and 2 out of 3 leads is the same as sinus suggest that these may be PACs and PVCs   No past medical history on file.    Surgical History: No past surgical history on file.   Home Meds: Current Meds  Medication Sig   KARIVA 0.15-0.02/0.01 MG (21/5) tablet Take 1 tablet by mouth daily.    Allergies: No Known Allergies  Social History   Socioeconomic History   Marital status: Married    Spouse name: Not on file   Number of children: Not on file   Years of education: Not on file   Highest education level: Not on file  Occupational History   Not on file  Tobacco Use   Smoking status: Never   Smokeless tobacco: Never  Substance and Sexual Activity   Alcohol use: Not on file   Drug use: Not on file   Sexual activity: Not on file  Other Topics Concern   Not on file  Social History Narrative   Not on file   Social Determinants of Health   Financial Resource Strain: Not on file  Food Insecurity: Not on file  Transportation Needs: Not on file  Physical Activity: Not on file  Stress: Not on file  Social Connections: Not on file  Intimate Partner Violence: Not on file     No family history on file.   ROS:  Please see the history of present illness.     All other systems reviewed and  negative.    Physical Exam:  Blood pressure 136/80, pulse 64, height 5' 6"  (1.676 m), weight 137 lb (62.1 kg), SpO2 97 %.      Labs: Cardiac Enzymes No results for input(s): CKTOTAL, CKMB, TROPONINI in the last 72 hours. CBC Lab Results  Component Value Date   WBC 9.9 01/01/2010   HGB 10.6 (L) 01/01/2010   HCT 30.3 (L) 01/01/2010   MCV 92.1 01/01/2010   PLT 161 01/01/2010   PROTIME: No results for input(s): LABPROT, INR in the last 72 hours. Chemistry No results for input(s): NA, K, CL, CO2, BUN, CREATININE, CALCIUM, PROT, BILITOT, ALKPHOS, ALT, AST, GLUCOSE in the last 168 hours.  Invalid input(s): LABALBU Lipids No results found for: CHOL, HDL, LDLCALC, TRIG BNP No results found for: PROBNP Thyroid Function Tests: No results for input(s): TSH, T4TOTAL, T3FREE, THYROIDAB in the last 72 hours.  Invalid input(s): FREET3 Miscellaneous No results found for: DDIMER  Radiology/Studies:  No results found.  EKG: sinus @ 64 15/09/45   Assessment and Plan:  Family history of apical HCM  The issues were discussed today regarding diagnosis and a candidate with a proband with apical HCM.  Genetic testing of the proband is ideal; I will discuss this further with the patient's father.  In the event that that he is either negative or declined to be done, serial echocardiograms through her mid 34s is recommended.  This would be true also for her brother.  We will schedule that sometime before the end of the year at the [facility] office so as to avoid hospital-based billing [name]

## 2021-06-03 NOTE — Patient Instructions (Signed)
Medication Instructions:  Your physician recommends that you continue on your current medications as directed. Please refer to the Current Medication list given to you today.  *If you need a refill on your cardiac medications before your next appointment, please call your pharmacy*   Lab Work: None ordered.  If you have labs (blood work) drawn today and your tests are completely normal, you will receive your results only by: [place] (if you have MyChart) OR A paper copy in the mail If you have any lab test that is abnormal or we need to change your treatment, we will call you to review the results.   Testing/Procedures: Your physician has requested that you have an echocardiogram. Echocardiography is a painless test that uses sound waves to create images of your heart. It provides your doctor with information about the size and shape of your heart and how well your heart's chambers and valves are working. This procedure takes approximately one hour. There are no restrictions for this procedure.    Follow-Up: At [facility], you and your health needs are our priority.  As part of our continuing mission to provide you with exceptional heart care, we have created designated Provider Care Teams.  These Care Teams include your primary Cardiologist (physician) and Advanced Practice Providers (APPs -  Physician Assistants and Nurse Practitioners) who all work together to provide you with the care you need, when you need it.  We recommend signing up for the patient portal called "MyChart".  Sign up information is provided on this After Visit Summary.  MyChart is used to connect with patients for Virtual Visits (Telemedicine).  Patients are able to view lab/test results, encounter notes, upcoming appointments, etc.  Non-urgent messages can be sent to your provider as well.   To learn more about what you can do with MyChart, go to [email or web address].    Your next appointment:    Follow up with Dr Caryl Comes as needed

## 2021-07-16 ENCOUNTER — Ambulatory Visit (INDEPENDENT_AMBULATORY_CARE_PROVIDER_SITE_OTHER): Payer: BC Managed Care – PPO

## 2021-07-16 ENCOUNTER — Other Ambulatory Visit: Payer: Self-pay

## 2021-07-16 DIAGNOSIS — Z8249 Family history of ischemic heart disease and other diseases of the circulatory system: Secondary | ICD-10-CM | POA: Diagnosis not present

## 2021-07-16 LAB — ECHOCARDIOGRAM COMPLETE
AR max vel: 2.82 cm2
AV Area VTI: 2.89 cm2
AV Area mean vel: 2.8 cm2
AV Mean grad: 5 mmHg
AV Peak grad: 9 mmHg
Ao pk vel: 1.5 m/s
Area-P 1/2: 3.13 cm2
Calc EF: 60.8 %
S' Lateral: 3.4 cm
Single Plane A2C EF: 62.6 %
Single Plane A4C EF: 56.8 %

## 2021-07-21 ENCOUNTER — Encounter: Payer: Self-pay | Admitting: Internal Medicine

## 2021-08-13 ENCOUNTER — Telehealth: Payer: Self-pay

## 2021-08-13 NOTE — Telephone Encounter (Signed)
-----   Message from Deboraha Sprang, MD sent at 08/09/2021  4:11 PM EST ----- Please Inform Patient Echo showed  normal heart muscle function   With normal wall thickness and noevidence of HCM  shojhld be checked at age 46 and then no further testing indicated

## 2021-08-13 NOTE — Telephone Encounter (Signed)
Attempted phone call to pt.  OK per Epic to leave detailed voicemail message.  Pt advised Dr Caryl Comes has reviewed echo which shows normal heart muscle function and no evidence of HCM.  Pt should be checked again at age 46 and no further testing indicated. Pt advised to contact [phone] for any additional questions or concerns.

## 2021-12-05 DIAGNOSIS — Z6821 Body mass index (BMI) 21.0-21.9, adult: Secondary | ICD-10-CM | POA: Diagnosis not present

## 2021-12-05 DIAGNOSIS — Z1231 Encounter for screening mammogram for malignant neoplasm of breast: Secondary | ICD-10-CM | POA: Diagnosis not present

## 2021-12-05 DIAGNOSIS — Z01419 Encounter for gynecological examination (general) (routine) without abnormal findings: Secondary | ICD-10-CM | POA: Diagnosis not present

## 2022-01-26 DIAGNOSIS — Z1211 Encounter for screening for malignant neoplasm of colon: Secondary | ICD-10-CM | POA: Diagnosis not present

## 2022-01-26 DIAGNOSIS — Z1212 Encounter for screening for malignant neoplasm of rectum: Secondary | ICD-10-CM | POA: Diagnosis not present

## 2022-01-31 LAB — COLOGUARD: COLOGUARD: NEGATIVE

## 2022-01-31 LAB — EXTERNAL GENERIC LAB PROCEDURE: COLOGUARD: NEGATIVE

## 2022-04-15 DIAGNOSIS — R946 Abnormal results of thyroid function studies: Secondary | ICD-10-CM | POA: Diagnosis not present

## 2022-04-15 DIAGNOSIS — J302 Other seasonal allergic rhinitis: Secondary | ICD-10-CM | POA: Diagnosis not present

## 2022-04-15 DIAGNOSIS — E559 Vitamin D deficiency, unspecified: Secondary | ICD-10-CM | POA: Diagnosis not present

## 2022-04-15 DIAGNOSIS — G4700 Insomnia, unspecified: Secondary | ICD-10-CM | POA: Diagnosis not present

## 2022-09-24 ENCOUNTER — Ambulatory Visit: Payer: Self-pay

## 2022-09-24 ENCOUNTER — Ambulatory Visit (INDEPENDENT_AMBULATORY_CARE_PROVIDER_SITE_OTHER): Payer: BC Managed Care – PPO | Admitting: Sports Medicine

## 2022-09-24 VITALS — BP 136/82 | Ht 67.0 in | Wt 140.0 lb

## 2022-09-24 DIAGNOSIS — G8929 Other chronic pain: Secondary | ICD-10-CM

## 2022-09-24 DIAGNOSIS — M25562 Pain in left knee: Secondary | ICD-10-CM

## 2022-09-24 NOTE — Progress Notes (Signed)
PCP: Scifres, Dorothy, PA-C (Inactive)  Subjective:   HPI:  Patient is a 47 y.o. female here for bilateral knee pain. L knee worse than R.  Pain at knee cap and feels like she shouldn't put her full weight on knee while riding her bicycle. She describes it as discomfort when pushing down on the petal. She can still finish her bike rides but afterward will feel like something is not right, like the knee is tight and cannot flex fully. Also has significant pain when doing squats which she quit doing. She has not taken pain medication, doesn't feel the pain is bad enough. She is planning for 200 mile race this summer and wants to make sure her knees are okay to continue training.    Has been seen multiple times in the past for knee pain and provided with custom orthotics in 2014 which she is still using today. Used to run a lot (marathons) but stopped running as much and now rides her bike.   BP 136/82   Ht 5' 7"$  (1.702 m)   Wt 140 lb (63.5 kg)   BMI 21.93 kg/m    Objective:  Physical Exam:  Gen: awake, alert, NAD, comfortable in exam room Pulm: breathing unlabored Bilateral Knees: - Inspection: no gross deformity. No swelling/effusion, erythema or bruising. Skin intact - Palpation: no TTP - ROM: full active ROM with flexion and extension - Strength: 5/5 strength - Neuro/vasc: NV intact - Special Tests: - LIGAMENTS: negative anterior and posterior drawer, negative Lachman's, no MCL or LCL laxity  -- MENISCUS: negative McMurray's -- PF JOINT: nml patellar mobility bilaterally. Mild pain and crepitus heard with one legged squats  MSK ultrasound knee: Left Patellar and quadriceps tendons were well visualized with no abnormalities. Small effusion in suprapatellar pouch.  Bone spurs present at medial joint line - small Small spur off superior patella Medial and lateral menisci were well visualized with no abnormalities. Trochlear groove shows medial spur but good  cartilage  Right Patellar and quadriceps tendons were well visualized with no abnormalities. No effusion. Medial and lateral menisci were well visualized with no abnormalities.   Assessment & Plan:   Patellofemoral syndrome Pain and crepitus likely related to knees caps not tracking smoothly in the trochlear groove and on the left knee, catching on bone spurs seen on ultrasound.  The tightness and inability to fully flex the right knee which she experiences after a bike ride is likely related to an effusion. -Patient encouraged to change the gear on her bicycle to assist her more when going up hills to relieve pressure on her knees -Can ice knees after a bike ride -Provided handouts on home exercises to do 4 times weekly to strengthen the quads  -Return in a few months for follow-up  Consider patellar strap on left knee if this lessens pain  Precious Gilding, DO Family medicine resident, PGY-2  I observed and examined the patient with the resident and agree with assessment and plan.  Note reviewed and modified by me. Ila Mcgill, MD

## 2022-11-30 DIAGNOSIS — M1711 Unilateral primary osteoarthritis, right knee: Secondary | ICD-10-CM | POA: Diagnosis not present

## 2022-11-30 DIAGNOSIS — M170 Bilateral primary osteoarthritis of knee: Secondary | ICD-10-CM | POA: Diagnosis not present

## 2022-11-30 DIAGNOSIS — M1712 Unilateral primary osteoarthritis, left knee: Secondary | ICD-10-CM | POA: Diagnosis not present

## 2022-12-03 ENCOUNTER — Ambulatory Visit: Payer: BC Managed Care – PPO | Admitting: Sports Medicine

## 2022-12-14 DIAGNOSIS — M222X1 Patellofemoral disorders, right knee: Secondary | ICD-10-CM | POA: Diagnosis not present

## 2022-12-14 DIAGNOSIS — M222X2 Patellofemoral disorders, left knee: Secondary | ICD-10-CM | POA: Diagnosis not present

## 2022-12-14 DIAGNOSIS — M6281 Muscle weakness (generalized): Secondary | ICD-10-CM | POA: Diagnosis not present

## 2022-12-17 DIAGNOSIS — Z124 Encounter for screening for malignant neoplasm of cervix: Secondary | ICD-10-CM | POA: Diagnosis not present

## 2022-12-17 DIAGNOSIS — Z1231 Encounter for screening mammogram for malignant neoplasm of breast: Secondary | ICD-10-CM | POA: Diagnosis not present

## 2022-12-17 DIAGNOSIS — Z01419 Encounter for gynecological examination (general) (routine) without abnormal findings: Secondary | ICD-10-CM | POA: Diagnosis not present

## 2023-01-13 DIAGNOSIS — M222X2 Patellofemoral disorders, left knee: Secondary | ICD-10-CM | POA: Diagnosis not present

## 2023-01-13 DIAGNOSIS — M222X1 Patellofemoral disorders, right knee: Secondary | ICD-10-CM | POA: Diagnosis not present

## 2023-01-13 DIAGNOSIS — M6281 Muscle weakness (generalized): Secondary | ICD-10-CM | POA: Diagnosis not present

## 2023-03-01 DIAGNOSIS — M222X1 Patellofemoral disorders, right knee: Secondary | ICD-10-CM | POA: Diagnosis not present

## 2023-03-01 DIAGNOSIS — M6281 Muscle weakness (generalized): Secondary | ICD-10-CM | POA: Diagnosis not present

## 2023-03-01 DIAGNOSIS — M222X2 Patellofemoral disorders, left knee: Secondary | ICD-10-CM | POA: Diagnosis not present

## 2023-04-28 DIAGNOSIS — M6281 Muscle weakness (generalized): Secondary | ICD-10-CM | POA: Diagnosis not present

## 2023-04-28 DIAGNOSIS — M222X2 Patellofemoral disorders, left knee: Secondary | ICD-10-CM | POA: Diagnosis not present

## 2023-04-28 DIAGNOSIS — M222X1 Patellofemoral disorders, right knee: Secondary | ICD-10-CM | POA: Diagnosis not present

## 2023-06-09 DIAGNOSIS — M222X1 Patellofemoral disorders, right knee: Secondary | ICD-10-CM | POA: Diagnosis not present

## 2023-06-09 DIAGNOSIS — M222X2 Patellofemoral disorders, left knee: Secondary | ICD-10-CM | POA: Diagnosis not present

## 2023-06-09 DIAGNOSIS — M6281 Muscle weakness (generalized): Secondary | ICD-10-CM | POA: Diagnosis not present

## 2023-06-30 DIAGNOSIS — G4700 Insomnia, unspecified: Secondary | ICD-10-CM | POA: Diagnosis not present

## 2023-06-30 DIAGNOSIS — L709 Acne, unspecified: Secondary | ICD-10-CM | POA: Diagnosis not present

## 2023-07-13 ENCOUNTER — Encounter (HOSPITAL_COMMUNITY): Payer: Self-pay

## 2023-07-13 ENCOUNTER — Ambulatory Visit (HOSPITAL_COMMUNITY)
Admission: EM | Admit: 2023-07-13 | Discharge: 2023-07-13 | Disposition: A | Discharge status: Home / Self Care | Payer: BC Managed Care – PPO | Attending: Family Medicine | Admitting: Family Medicine

## 2023-07-13 DIAGNOSIS — W540XXA Bitten by dog, initial encounter: Secondary | ICD-10-CM | POA: Diagnosis not present

## 2023-07-13 DIAGNOSIS — Z203 Contact with and (suspected) exposure to rabies: Secondary | ICD-10-CM

## 2023-07-13 MED ORDER — RABIES IMMUNE GLOBULIN 150 UNIT/ML IM INJ
INJECTION | INTRAMUSCULAR | Status: AC
  Filled 2023-07-13: qty 10

## 2023-07-13 MED ORDER — RABIES IMMUNE GLOBULIN 150 UNIT/ML IM INJ
20.0000 [IU]/kg | INJECTION | Freq: Once | INTRAMUSCULAR | Status: AC
  Administered 2023-07-13: 1275 [IU]

## 2023-07-13 MED ORDER — RABIES VACCINE, PCEC IM SUSR
INTRAMUSCULAR | Status: AC
  Filled 2023-07-13: qty 1

## 2023-07-13 MED ORDER — RABIES VACCINE, PCEC IM SUSR
1.0000 mL | Freq: Once | INTRAMUSCULAR | Status: AC
  Administered 2023-07-13: 1 mL via INTRAMUSCULAR

## 2023-07-13 NOTE — ED Triage Notes (Signed)
Patient here today with c/o a dog bite on right lower leg while she was riding her bicycle. Patient states that there is one puncture. Animal Control Report number is [id number]. Last Tetanus was in 2022.

## 2023-07-13 NOTE — Discharge Instructions (Signed)
You were given rabies immunoglobulin today  You are also given your first dose of rabies vaccine  Come back in 3 days from today, 7 days from today and 14 days from today.  You can make online appointments for your vaccine days.

## 2023-07-13 NOTE — ED Provider Notes (Signed)
[facility]    CSN: [id number] Arrival date & time: 07/13/23  1618      History   Chief Complaint Chief Complaint  Patient presents with   Animal Bite    HPI Angela Foster is a 47 y.o. female.    Animal Bite  Here for a dog bit sustained to her right lower leg while bike riding on 12/1.  The dog bit her on her right lateral lower leg, with one puncture wound. Healing ok. Animal control apparently were unable to obtain the dog from the owner to observe for signs of illness.  Last tetanus was 2022. NKDA   History reviewed. No pertinent past medical history.  Patient Active Problem List   Diagnosis Date Noted   Left anterior knee pain 04/19/2013   Abnormality of gait 04/19/2013    Past Surgical History:  Procedure Laterality Date   DNC      OB History   No obstetric history on file.      Home Medications    Prior to Admission medications   Medication Sig Start Date End Date Taking? Authorizing Provider  traZODone (DESYREL) 50 MG tablet Take 25-50 mg by mouth at bedtime. 06/30/23  Yes [provider]  tretinoin (RETIN-A) 0.025 % cream Apply topically at bedtime. 06/30/23  Yes [provider]  LOW-OGESTREL 0.3-30 MG-MCG tablet Take 1 tablet by mouth daily.    [provider]    Family History History reviewed. No pertinent family history.  Social History Social History   Tobacco Use   Smoking status: Never   Smokeless tobacco: Never  Vaping Use   Vaping status: Never Used  Substance Use Topics   Alcohol use: Yes    Comment: Rare   Drug use: Never     Allergies   Patient has no known allergies.   Review of Systems Review of Systems   Physical Exam Triage Vital Signs ED Triage Vitals [07/13/23 1741]  Encounter Vitals Group     BP (!) 144/90     Systolic BP Percentile      Diastolic BP Percentile      Pulse Rate (!) 54     Resp 16     Temp 98.7 F (37.1 C)     Temp Source Oral     SpO2 95  %     Weight 142 lb (64.4 kg)     Height 5\' 7"  (1.702 m)     Head Circumference      Peak Flow      Pain Score 0     Pain Loc      Pain Education      Exclude from Growth Chart    No data found.  Updated Vital Signs BP (!) 144/90 (BP Location: Right Arm)   Pulse (!) 54   Temp 98.7 F (37.1 C) (Oral)   Resp 16   Ht 5\' 7"  (1.702 m)   Wt 64.4 kg   LMP  (LMP Unknown)   SpO2 95%   BMI 22.24 kg/m   Visual Acuity Right Eye Distance:   Left Eye Distance:   Bilateral Distance:    Right Eye Near:   Left Eye Near:    Bilateral Near:     Physical Exam Vitals reviewed.  Constitutional:      General: She is not in acute distress.    Appearance: She is not toxic-appearing.  Skin:    Coloration: Skin is not jaundiced or pale.  Comments: There is a healing wound about 4 mm in diameter on lateral right lower leg, with some ecchymosis surrounding it. No dc  Neurological:     Mental Status: She is alert and oriented to person, place, and time.  Psychiatric:        Behavior: Behavior normal.      UC Treatments / Results  Labs (all labs ordered are listed, but only abnormal results are displayed) Labs Reviewed - No data to display  EKG   Radiology No results found.  Procedures Procedures (including critical care time)  Medications Ordered in UC Medications  rabies immune globulin (HYPERRAB/KEDRAB) injection 1,275 Units (has no administration in time range)  rabies vaccine (RABAVERT) injection 1 mL (has no administration in time range)    Initial Impression / Assessment and Plan / UC Course  I have reviewed the triage vital signs and the nursing notes.  Pertinent labs & imaging results that were available during my care of the patient were reviewed by me and considered in my medical decision making (see chart for details).     With the animal control not being able to observe the dog (the pt and I are uncertain why), rabies immunoglobulin is given and  vaccination series is begun today. She does not need a tetanus Final Clinical Impressions(s) / UC Diagnoses   Final diagnoses:  Dog bite, initial encounter  Exposure to rabies     Discharge Instructions      You were given rabies immunoglobulin today  You are also given your first dose of rabies vaccine  Come back in 3 days from today, 7 days from today and 14 days from today.  You can make online appointments for your vaccine days.    ED Prescriptions   None    PDMP not reviewed this encounter.   Zenia Resides, MD 07/13/23 [phone]

## 2023-07-16 ENCOUNTER — Ambulatory Visit (HOSPITAL_COMMUNITY)
Admission: EM | Admit: 2023-07-16 | Discharge: 2023-07-16 | Disposition: A | Discharge status: Home / Self Care | Payer: BC Managed Care – PPO | Attending: Internal Medicine | Admitting: Internal Medicine

## 2023-07-16 DIAGNOSIS — W540XXA Bitten by dog, initial encounter: Secondary | ICD-10-CM | POA: Diagnosis not present

## 2023-07-16 DIAGNOSIS — Z203 Contact with and (suspected) exposure to rabies: Secondary | ICD-10-CM

## 2023-07-16 MED ORDER — RABIES VACCINE, PCEC IM SUSR
1.0000 mL | Freq: Once | INTRAMUSCULAR | Status: AC
  Administered 2023-07-16: 1 mL via INTRAMUSCULAR

## 2023-07-16 MED ORDER — RABIES VACCINE, PCEC IM SUSR
INTRAMUSCULAR | Status: AC
  Filled 2023-07-16: qty 1

## 2023-07-16 NOTE — ED Triage Notes (Signed)
Pt here for 2nd rabies vaccine

## 2023-07-20 ENCOUNTER — Ambulatory Visit (HOSPITAL_COMMUNITY)
Admission: EM | Admit: 2023-07-20 | Discharge: 2023-07-20 | Disposition: A | Discharge status: Home / Self Care | Payer: BC Managed Care – PPO | Attending: Emergency Medicine | Admitting: Emergency Medicine

## 2023-07-20 DIAGNOSIS — Z203 Contact with and (suspected) exposure to rabies: Secondary | ICD-10-CM

## 2023-07-20 MED ORDER — RABIES VACCINE, PCEC IM SUSR
1.0000 mL | Freq: Once | INTRAMUSCULAR | Status: AC
  Administered 2023-07-20: 1 mL via INTRAMUSCULAR

## 2023-07-20 MED ORDER — RABIES VACCINE, PCEC IM SUSR
INTRAMUSCULAR | Status: AC
  Filled 2023-07-20: qty 1

## 2023-07-20 NOTE — ED Triage Notes (Signed)
Pt here for 3rd vaccine/

## 2023-07-27 ENCOUNTER — Ambulatory Visit (HOSPITAL_COMMUNITY)
Admission: RE | Admit: 2023-07-27 | Discharge: 2023-07-27 | Disposition: A | Discharge status: Home / Self Care | Payer: BC Managed Care – PPO | Source: Ambulatory Visit | Attending: Family Medicine | Admitting: Family Medicine

## 2023-07-27 ENCOUNTER — Ambulatory Visit (HOSPITAL_COMMUNITY): Payer: BC Managed Care – PPO

## 2023-07-27 DIAGNOSIS — W540XXA Bitten by dog, initial encounter: Secondary | ICD-10-CM | POA: Diagnosis not present

## 2023-07-27 DIAGNOSIS — Z203 Contact with and (suspected) exposure to rabies: Secondary | ICD-10-CM | POA: Diagnosis not present

## 2023-07-27 MED ORDER — RABIES VACCINE, PCEC IM SUSR
INTRAMUSCULAR | Status: AC
  Filled 2023-07-27: qty 1

## 2023-07-27 MED ORDER — RABIES VACCINE, PCEC IM SUSR
1.0000 mL | Freq: Once | INTRAMUSCULAR | Status: AC
  Administered 2023-07-27: 1 mL via INTRAMUSCULAR

## 2023-07-27 NOTE — ED Notes (Signed)
Rabies vaccine given in the left deltoid. Patient tolerated well.

## 2023-07-27 NOTE — ED Triage Notes (Signed)
Presenting for her day 14 rabies vaccine. Denies any reaction to previous injections.

## 2023-08-30 DIAGNOSIS — M79641 Pain in right hand: Secondary | ICD-10-CM | POA: Diagnosis not present

## 2023-09-02 DIAGNOSIS — D225 Melanocytic nevi of trunk: Secondary | ICD-10-CM | POA: Diagnosis not present

## 2023-09-02 DIAGNOSIS — L814 Other melanin hyperpigmentation: Secondary | ICD-10-CM | POA: Diagnosis not present

## 2023-09-02 DIAGNOSIS — L821 Other seborrheic keratosis: Secondary | ICD-10-CM | POA: Diagnosis not present

## 2023-09-02 DIAGNOSIS — L578 Other skin changes due to chronic exposure to nonionizing radiation: Secondary | ICD-10-CM | POA: Diagnosis not present

## 2023-11-29 DIAGNOSIS — K4090 Unilateral inguinal hernia, without obstruction or gangrene, not specified as recurrent: Secondary | ICD-10-CM | POA: Diagnosis not present

## 2023-12-16 DIAGNOSIS — K4090 Unilateral inguinal hernia, without obstruction or gangrene, not specified as recurrent: Secondary | ICD-10-CM | POA: Diagnosis not present

## 2024-01-26 DIAGNOSIS — Z1231 Encounter for screening mammogram for malignant neoplasm of breast: Secondary | ICD-10-CM | POA: Diagnosis not present

## 2024-04-25 DIAGNOSIS — Z6821 Body mass index (BMI) 21.0-21.9, adult: Secondary | ICD-10-CM | POA: Diagnosis not present

## 2024-04-25 DIAGNOSIS — Z124 Encounter for screening for malignant neoplasm of cervix: Secondary | ICD-10-CM | POA: Diagnosis not present

## 2024-04-25 DIAGNOSIS — Z01419 Encounter for gynecological examination (general) (routine) without abnormal findings: Secondary | ICD-10-CM | POA: Diagnosis not present

## 2024-07-26 DIAGNOSIS — E039 Hypothyroidism, unspecified: Secondary | ICD-10-CM | POA: Diagnosis not present

## 2024-07-26 DIAGNOSIS — G4700 Insomnia, unspecified: Secondary | ICD-10-CM | POA: Diagnosis not present

## 2024-07-26 DIAGNOSIS — J4520 Mild intermittent asthma, uncomplicated: Secondary | ICD-10-CM | POA: Diagnosis not present

## 2024-07-26 DIAGNOSIS — Z0000 Encounter for general adult medical examination without abnormal findings: Secondary | ICD-10-CM | POA: Diagnosis not present

## 2024-07-26 DIAGNOSIS — R030 Elevated blood-pressure reading, without diagnosis of hypertension: Secondary | ICD-10-CM | POA: Diagnosis not present
